# Patient Record
Sex: Female | Born: 1950 | Hispanic: No | Marital: Married | Smoking: Never smoker
Health system: Southern US, Community
[De-identification: ages and names within clinical notes are randomized; demographics above are authoritative.]

## PROBLEM LIST (undated history)

## (undated) DIAGNOSIS — L309 Dermatitis, unspecified: Secondary | ICD-10-CM

## (undated) DIAGNOSIS — I10 Essential (primary) hypertension: Secondary | ICD-10-CM

## (undated) DIAGNOSIS — T7840XA Allergy, unspecified, initial encounter: Secondary | ICD-10-CM

## (undated) DIAGNOSIS — D126 Benign neoplasm of colon, unspecified: Secondary | ICD-10-CM

## (undated) DIAGNOSIS — Z803 Family history of malignant neoplasm of breast: Secondary | ICD-10-CM

## (undated) HISTORY — DX: Benign neoplasm of colon, unspecified: D12.6

## (undated) HISTORY — DX: Allergy, unspecified, initial encounter: T78.40XA

## (undated) HISTORY — DX: Essential (primary) hypertension: I10

## (undated) HISTORY — PX: TUBAL LIGATION: SHX77

## (undated) HISTORY — DX: Family history of malignant neoplasm of breast: Z80.3

## (undated) HISTORY — DX: Dermatitis, unspecified: L30.9

---

## 1997-11-28 ENCOUNTER — Other Ambulatory Visit: Admission: RE | Admit: 1997-11-28 | Discharge: 1997-11-28 | Payer: Self-pay | Admitting: Obstetrics and Gynecology

## 1999-12-20 ENCOUNTER — Other Ambulatory Visit: Admission: RE | Admit: 1999-12-20 | Discharge: 1999-12-20 | Payer: Self-pay | Admitting: Obstetrics and Gynecology

## 1999-12-20 ENCOUNTER — Encounter (INDEPENDENT_AMBULATORY_CARE_PROVIDER_SITE_OTHER): Payer: Self-pay

## 2002-04-02 ENCOUNTER — Other Ambulatory Visit: Admission: RE | Admit: 2002-04-02 | Discharge: 2002-04-02 | Payer: Self-pay | Admitting: Obstetrics and Gynecology

## 2002-09-19 HISTORY — PX: LASIK: SHX215

## 2003-05-15 ENCOUNTER — Other Ambulatory Visit: Admission: RE | Admit: 2003-05-15 | Discharge: 2003-05-15 | Payer: Self-pay | Admitting: Obstetrics and Gynecology

## 2004-05-19 ENCOUNTER — Other Ambulatory Visit: Admission: RE | Admit: 2004-05-19 | Discharge: 2004-05-19 | Payer: Self-pay | Admitting: Obstetrics and Gynecology

## 2004-07-23 ENCOUNTER — Ambulatory Visit: Payer: Self-pay | Admitting: Gastroenterology

## 2004-08-06 ENCOUNTER — Ambulatory Visit: Payer: Self-pay | Admitting: Gastroenterology

## 2004-09-17 ENCOUNTER — Ambulatory Visit: Payer: Self-pay | Admitting: Internal Medicine

## 2005-05-20 ENCOUNTER — Other Ambulatory Visit: Admission: RE | Admit: 2005-05-20 | Discharge: 2005-05-20 | Payer: Self-pay | Admitting: Obstetrics and Gynecology

## 2008-09-25 ENCOUNTER — Encounter: Admission: RE | Admit: 2008-09-25 | Discharge: 2008-09-25 | Payer: Self-pay | Admitting: Obstetrics and Gynecology

## 2010-10-10 ENCOUNTER — Encounter: Payer: Self-pay | Admitting: Obstetrics and Gynecology

## 2011-11-02 ENCOUNTER — Encounter: Payer: Self-pay | Admitting: Gastroenterology

## 2014-05-21 ENCOUNTER — Encounter: Payer: Self-pay | Admitting: Gastroenterology

## 2014-07-23 ENCOUNTER — Encounter: Payer: Self-pay | Admitting: Gastroenterology

## 2014-09-19 HISTORY — PX: COLONOSCOPY: SHX174

## 2014-09-22 ENCOUNTER — Ambulatory Visit (AMBULATORY_SURGERY_CENTER): Payer: BLUE CROSS/BLUE SHIELD | Admitting: *Deleted

## 2014-09-22 VITALS — Ht 67.0 in | Wt 192.8 lb

## 2014-09-22 DIAGNOSIS — Z1211 Encounter for screening for malignant neoplasm of colon: Secondary | ICD-10-CM

## 2014-09-22 MED ORDER — MOVIPREP 100 G PO SOLR: 1.0000 | Freq: Once | ORAL | Status: DC

## 2014-09-22 NOTE — Progress Notes (Signed)
Denies allergies to eggs or soy products. Denies complications with sedation or anesthesia. Denies O2 use. Denies use of diet or weight loss medications.  Emmi instructions given for colonoscopy.  

## 2014-10-03 ENCOUNTER — Encounter: Payer: Self-pay | Admitting: Gastroenterology

## 2014-10-03 ENCOUNTER — Ambulatory Visit (AMBULATORY_SURGERY_CENTER): Payer: BLUE CROSS/BLUE SHIELD | Admitting: Gastroenterology

## 2014-10-03 VITALS — BP 126/69 | HR 55 | Temp 98.0°F | Resp 17 | Ht 67.0 in | Wt 192.0 lb

## 2014-10-03 DIAGNOSIS — Z1211 Encounter for screening for malignant neoplasm of colon: Secondary | ICD-10-CM

## 2014-10-03 DIAGNOSIS — D122 Benign neoplasm of ascending colon: Secondary | ICD-10-CM

## 2014-10-03 DIAGNOSIS — K5730 Diverticulosis of large intestine without perforation or abscess without bleeding: Secondary | ICD-10-CM

## 2014-10-03 MED ORDER — SODIUM CHLORIDE 0.9 % IV SOLN: 500.0000 mL | INTRAVENOUS | Status: DC

## 2014-10-03 NOTE — Patient Instructions (Signed)

## 2014-10-03 NOTE — Progress Notes (Signed)
Report to PACU, RN, vss, BBS= Clear.  

## 2014-10-03 NOTE — Progress Notes (Signed)
Called to room to assist during endoscopic procedure.  Patient ID and intended procedure confirmed with present staff. Received instructions for my participation in the procedure from the performing physician.  

## 2014-10-03 NOTE — Op Note (Signed)
[facility]  [address] [place], [id number]   COLONOSCOPY PROCEDURE REPORT  PATIENT: Terri, [name]  MR#: [id number] BIRTHDATE: November 12, 1950 , 63  yrs. old GENDER: female ENDOSCOPIST: Milus Banister, MD PROCEDURE DATE:  10/03/2014 PROCEDURE:   Colonoscopy with snare polypectomy First Screening Colonoscopy - Avg.  risk and is 50 yrs.  old or older - No.  Prior Negative Screening - Now for repeat screening. 10 or more years since last screening  History of Adenoma - Now for follow-up colonoscopy & has been > or = to 3 yrs.  N/A  Polyps Removed Today? Yes. ASA CLASS:   Class II INDICATIONS:average risk for colon cancer (colonoscopy Dr. Sharlett Iles 2005 found no polyps) MEDICATIONS: Monitored anesthesia care and Propofol 200 mg IV  DESCRIPTION OF PROCEDURE:   After the risks benefits and alternatives of the procedure were thoroughly explained, informed consent was obtained.  The digital rectal exam revealed no abnormalities of the rectum.   The LB [id number]-[id number]  endoscope was introduced through the anus and advanced to the cecum, which was identified by both the appendix and ileocecal valve. No adverse events experienced.   The quality of the prep was excellent.  The instrument was then slowly withdrawn as the colon was fully examined.  COLON FINDINGS: One polyp was found, removed and sent to pathology. This was sessile, 22mm across, located in ascending segment, removed with snare/cautery (jar 1).  There several divertulum throughout the colon, most densly populated in left colon.  The examination was otherwise normal.  Retroflexed views revealed no abnormalities. The time to cecum=5 minutes 52 seconds.  Withdrawal time=8 minutes 44 seconds.  The scope was withdrawn and the procedure completed. COMPLICATIONS: There were no immediate complications.  ENDOSCOPIC IMPRESSION: One polyp was found, removed and sent to pathology.  There several divertulum  throughout the colon, most densly populated in left colon.  The examination was otherwise normal  RECOMMENDATIONS: 1. If the polyp(s) removed today are proven to be adenomatous (pre-cancerous) polyps, you will need a repeat colonoscopy in 5 years.  Otherwise you should continue to follow colorectal cancer screening guidelines for "routine risk" patients with colonoscopy in 10 years.  You will receive a letter within 1-2 weeks with the results of your biopsy as well as final recommendations.  Please call my office if you have not received a letter after 3 weeks. 2. Please try once daily fiber supplement with citrucel orange powder.  eSigned:  Milus Banister, MD 10/03/2014 8:17 AM

## 2014-10-06 ENCOUNTER — Telehealth: Payer: Self-pay | Admitting: *Deleted

## 2014-10-06 NOTE — Telephone Encounter (Signed)
  Follow up Call-  Call back number 10/03/2014  Post procedure Call Back phone  # [phone]  Permission to leave phone message Yes     Patient questions:  Do you have a fever, pain , or abdominal swelling? No. Pain Score  0 *  Have you tolerated food without any problems? Yes.    Have you been able to return to your normal activities? Yes.    Do you have any questions about your discharge instructions: Diet   No. Medications  No. Follow up visit  No.  Do you have questions or concerns about your Care? No.  Actions: * If pain score is 4 or above: No action needed, pain <4.

## 2014-10-12 ENCOUNTER — Encounter: Payer: Self-pay | Admitting: Gastroenterology

## 2014-11-14 ENCOUNTER — Telehealth: Payer: Self-pay | Admitting: Gastroenterology

## 2014-11-14 NOTE — Telephone Encounter (Signed)
I've only met her for screening colonoscopy (1 month ago).  I agree with decreasing fiber supplement by 1/2.  Would want to see her in office before advising on creams, ointments for anal itching.  She needs GI next available with myself or extender.  Thanks

## 2014-11-14 NOTE — Telephone Encounter (Signed)
Patient reports that she has been taking Metamucil as recommended at the colonoscopy, but now her stools are too bulky.  I have advised her to cut the dose in half and increase the amount of fluid in her diet.  She also reports rectal itching.  She reports that she does not have any external hemorrhoids, but has tried OTC hemorrhoid cream and it caused burning.  She is advised that she can also try gyne lotrimin OTC lotion to see if that helps for possible yeast.  She states that she also has not been able to tolerate OTC yeast creams in the past, caused burning.  Dr. Ardis Hughs please advise

## 2014-11-14 NOTE — Telephone Encounter (Signed)
appt is scheduled with the patient for 11/21/14 1:15 with Cecille Rubin Hvozdovic, PA

## 2014-11-21 ENCOUNTER — Encounter: Payer: Self-pay | Admitting: Physician Assistant

## 2014-11-21 ENCOUNTER — Ambulatory Visit (INDEPENDENT_AMBULATORY_CARE_PROVIDER_SITE_OTHER): Payer: BLUE CROSS/BLUE SHIELD | Admitting: Physician Assistant

## 2014-11-21 VITALS — BP 146/82 | HR 70 | Ht 67.0 in | Wt 191.0 lb

## 2014-11-21 DIAGNOSIS — K644 Residual hemorrhoidal skin tags: Secondary | ICD-10-CM

## 2014-11-21 DIAGNOSIS — K648 Other hemorrhoids: Secondary | ICD-10-CM

## 2014-11-21 MED ORDER — HYDROCORTISONE ACETATE 25 MG RE SUPP: 25.0000 mg | Freq: Two times a day (BID) | RECTAL | Status: DC

## 2014-11-21 NOTE — Patient Instructions (Signed)
We have sent the following medications to your pharmacy for you to pick up at your convenience:] Anusol HC supp.  Please purchase Recticare cream 5% which is over the counter and use it as needed to the external hemorrhoids.  Continue your fiber.  Today you have been given a hemorrhoid handout to read.   I appreciate the opportunity to care for you.

## 2014-11-21 NOTE — Progress Notes (Signed)
Patient ID: Terri [name], female   DOB: 28-Mar-1951, 64 y.o.   MRN: [id number]     History of Present Illness: Terri [name]  is a pleasant 64 year old female known to Dr. Ardis Hughs from a screening colonoscopy. She recently had her colonoscopy in 10/03/2014 at which time one polyp was found removed and sent to pathology. There were several diverticulum throughout the colon, most densely populated in the left colon. The exam was otherwise normal. The patient's polyp was adenomatous and she was sent a letter have a surveillance colonoscopy in 5 years.  Terri [name] is here today because she has had a sensation of incomplete evacuation and has to wipe copiously after bowel movements. Her stools have been fairly regular, but she never feels she fully empties. She is also had rectal itching. She has had scant bloody spotting on the toilet tissue after wiping on several occasions. She has not had any abdominal pain.       Past Medical History  Diagnosis Date  . Serrated adenoma of colon     Past Surgical History  Procedure Laterality Date  . Lasik  2004  . Tubal ligation     Family History  Problem Relation Age of Onset  . Colon cancer Neg Hx   . Esophageal cancer Neg Hx   . Rectal cancer Neg Hx   . Stomach cancer Neg Hx    History  Substance Use Topics  . Smoking status: Never Smoker   . Smokeless tobacco: Never Used  . Alcohol Use: 0.0 oz/week    0 Standard drinks or equivalent per week     Comment: occas   Current Outpatient Prescriptions  Medication Sig Dispense Refill  . Calcium Citrate (CITRACAL PO) One teaspoon daily    . hydrocortisone (ANUSOL-HC) 25 MG suppository Place 1 suppository (25 mg total) rectally 2 (two) times daily. 20 suppository 1   No current facility-administered medications for this visit.   No Known Allergies    Review of Systems: Gen: Denies any fever, chills, sweats, anorexia, fatigue, weakness, malaise, weight loss, and sleep disorder CV: Denies  chest pain, angina, palpitations, syncope, orthopnea, PND, peripheral edema, and claudication. Resp: Denies dyspnea at rest, dyspnea with exercise, cough, sputum, wheezing, coughing up blood, and pleurisy. GI: Denies vomiting blood, jaundice, and fecal incontinence.   Denies dysphagia or odynophagia. GU : Denies urinary burning, blood in urine, urinary frequency, urinary hesitancy, nocturnal urination, and urinary incontinence. MS: Denies joint pain, limitation of movement, and swelling, stiffness, low back pain, extremity pain. Denies muscle weakness, cramps, atrophy.  Derm: Denies rash, itching, dry skin, hives, moles, warts, or unhealing ulcers.  Psych: Denies depression, anxiety, memory loss, suicidal ideation, hallucinations, paranoia, and confusion. Heme: Denies bruising, bleeding, and enlarged lymph nodes. Neuro:  Denies any headaches, dizziness, paresthesia Endo:  Denies any problems with DM, thyroid, adrenal  Endoscopies: Colonoscopy as per history of present illness.  Physical Exam: General: Pleasant, well developed female in no acute distress Head: Normocephalic and atraumatic Eyes:  sclerae anicteric, conjunctiva pink  Ears: Normal auditory acuity Lungs: Clear throughout to auscultation Heart: Regular rate and rhythm Abdomen: Soft, non distended, non-tender. No masses, no hepatomegaly. Normal bowel sounds Rectal: 2 small external hemorrhoids, anoscopy with a left lateral and posterior internal hemorrhoid Musculoskeletal: Symmetrical with no gross deformities  Extremities: No edema  Neurological: Alert oriented x 4, grossly nonfocal Psychological:  Alert and cooperative. Normal mood and affect  Assessment and Recommendations: 64 year old female with a sensation of incomplete evacuation and some anal  seepage here for evaluation her symptoms are likely due to her internal hemorrhoids. She's been advised to use her Citrucel twice daily. She will use Anusol HC suppositories 1 per  rectum twice a day for 10 days she will use Tucks wipes along with rectal care cream to the external hemorrhoids as needed. She will follow up as needed.    Mackenzi Krogh, Vita Barley PA-C 11/21/2014,

## 2014-11-24 NOTE — Progress Notes (Signed)
I agree with the above note, plan 

## 2014-12-03 ENCOUNTER — Other Ambulatory Visit: Payer: Self-pay | Admitting: Obstetrics and Gynecology

## 2014-12-04 LAB — CYTOLOGY - PAP

## 2017-06-29 ENCOUNTER — Ambulatory Visit (INDEPENDENT_AMBULATORY_CARE_PROVIDER_SITE_OTHER): Payer: Medicare Other | Admitting: Nurse Practitioner

## 2017-06-29 ENCOUNTER — Encounter (INDEPENDENT_AMBULATORY_CARE_PROVIDER_SITE_OTHER): Payer: Self-pay

## 2017-06-29 ENCOUNTER — Encounter: Payer: Self-pay | Admitting: Nurse Practitioner

## 2017-06-29 VITALS — BP 130/80 | HR 84 | Ht 67.0 in | Wt 192.0 lb

## 2017-06-29 DIAGNOSIS — L290 Pruritus ani: Secondary | ICD-10-CM

## 2017-06-29 DIAGNOSIS — L918 Other hypertrophic disorders of the skin: Secondary | ICD-10-CM | POA: Diagnosis not present

## 2017-06-29 MED ORDER — HYDROCORTISONE 2.5 % RE CREA: 1.0000 "application " | TOPICAL_CREAM | Freq: Every day | RECTAL | 0 refills | Status: DC

## 2017-06-29 NOTE — Patient Instructions (Signed)
If you are age 67 or older, your body mass index should be between 23-30. Your Body mass index is 30.07 kg/m. If this is out of the aforementioned range listed, please consider follow up with your Primary Care Provider.  If you are age 110 or younger, your body mass index should be between 19-25. Your Body mass index is 30.07 kg/m. If this is out of the aformentioned range listed, please consider follow up with your Primary Care Provider.   We have sent the following medications to your pharmacy for you to pick up at your convenience: Anusol cream  Thank you for choosing me and [facility].   Tye Savoy, NP

## 2017-06-29 NOTE — Progress Notes (Signed)
     Chief Complaint:  "sore on anus"  HPI: Patient is a 66 year old female known to Dr. Ardis Hughs. She has screening colonoscopy January 2016 with removal of a 7 mm serrated adenoma without dysplasia.  Patient comes in today for evaluation of what she thinks is a sore on the anus. She began having problems several months ago. The area burns when in contact with urine. She gets irritation from wiping and this sometimes leads to scant bleeding. She has excessive perianal itching  She has tried several over-the-counter creams including topical that she uses for her eczema. She cleans with wet wipes and then blots herself dry. Does not seem like anything that she has tried has made any difference. No pain inside the rectum. No unusually painful to have a BM. No abdominal pain. She is not constipated, does not strain.    Past Medical History:  Diagnosis Date  . Eczema   . Hypertension   . Serrated adenoma of colon     Patient's surgical history, family medical history, social history, medications and allergies were all reviewed in Epic    Physical Exam: BP 130/80   Pulse 84   Ht 5\' 7"  (1.702 m)   Wt 192 lb (87.1 kg)   BMI 30.07 kg/m   GENERAL:  Well developed white female in NAD PSYCH: :Pleasant, cooperative, normal affect RECTAL: at anterior midline there is a flesh colored bi -lobed tag. Unable to appreciate any fissures in or around the tag but it is slightly tender and erythematous at base.  PULM: Normal respiratory effort NEURO: Alert and oriented x 3, no focal neurologic deficits  ASSESSMENT and PLAN:   Pleasant 66 year old female with excessive perianal itching / discomfort and minor intermittent bleeding when wiping. Main complaint is that of itching which leads to excessive wiping and irritation. On exam she what appears to an irritated / tender hemorrhoidal tag vrs sentinel tag at anterior midline. Anoscopic exam not done -anusol cream in and around rectum for 7-10 days.    -can use wet wipes to gently clean after BM but the blot dry. -call me in a week with an update. If not improving we could ask colorectal surgery to evaluate her.     Tye Savoy , NP 06/29/2017, 8:50 AM

## 2017-07-02 NOTE — Progress Notes (Signed)
I agree with the above note, plan 

## 2017-07-03 ENCOUNTER — Telehealth: Payer: Self-pay | Admitting: Nurse Practitioner

## 2017-07-03 NOTE — Telephone Encounter (Signed)
Called patient for condition update. The perianal itching has significantly improved with anusol cream. Since the itching has improved she isn't irritating the anterior midline tag as much now. She will complete the tube of anusol cream and call me back if recurrent problems.

## 2019-09-16 ENCOUNTER — Encounter: Payer: Self-pay | Admitting: Gastroenterology

## 2019-09-16 NOTE — Telephone Encounter (Signed)
Opened in ERROR

## 2019-10-02 ENCOUNTER — Encounter: Payer: Self-pay | Admitting: Gastroenterology

## 2019-10-02 ENCOUNTER — Ambulatory Visit (AMBULATORY_SURGERY_CENTER): Payer: Self-pay

## 2019-10-02 ENCOUNTER — Other Ambulatory Visit: Payer: Self-pay

## 2019-10-02 VITALS — Temp 98.0°F | Ht 67.0 in | Wt 202.2 lb

## 2019-10-02 DIAGNOSIS — Z86010 Personal history of colonic polyps: Secondary | ICD-10-CM

## 2019-10-02 DIAGNOSIS — Z01818 Encounter for other preprocedural examination: Secondary | ICD-10-CM

## 2019-10-02 MED ORDER — NA SULFATE-K SULFATE-MG SULF 17.5-3.13-1.6 GM/177ML PO SOLN: 1.0000 | Freq: Once | ORAL | 0 refills | Status: AC

## 2019-10-02 NOTE — Progress Notes (Signed)

## 2019-10-08 ENCOUNTER — Ambulatory Visit (INDEPENDENT_AMBULATORY_CARE_PROVIDER_SITE_OTHER): Payer: Medicare Other

## 2019-10-08 DIAGNOSIS — Z1159 Encounter for screening for other viral diseases: Secondary | ICD-10-CM

## 2019-10-08 LAB — SARS CORONAVIRUS 2 (TAT 6-24 HRS): SARS Coronavirus 2: NEGATIVE

## 2019-10-11 ENCOUNTER — Ambulatory Visit (AMBULATORY_SURGERY_CENTER): Payer: Medicare Other | Admitting: Gastroenterology

## 2019-10-11 ENCOUNTER — Other Ambulatory Visit: Payer: Self-pay

## 2019-10-11 ENCOUNTER — Encounter: Payer: Self-pay | Admitting: Gastroenterology

## 2019-10-11 VITALS — BP 119/60 | HR 61 | Temp 98.3°F | Resp 31 | Ht 67.0 in | Wt 202.0 lb

## 2019-10-11 DIAGNOSIS — Z86010 Personal history of colonic polyps: Secondary | ICD-10-CM

## 2019-10-11 MED ORDER — SODIUM CHLORIDE 0.9 % IV SOLN
500.0000 mL | Freq: Once | INTRAVENOUS | Status: DC
Start: 2019-10-11 — End: 2019-10-11

## 2019-10-11 NOTE — Patient Instructions (Signed)
Thank you for letting us take care of  Your healthcare needs today, Please see handouts given to you on Diverticulosis   YOU HAD AN ENDOSCOPIC PROCEDURE TODAY AT [facility]:   Refer to the procedure report that was given to you for any specific questions about what was found during the examination.  If the procedure report does not answer your questions, please call your gastroenterologist to clarify.  If you requested that your care partner not be given the details of your procedure findings, then the procedure report has been included in a sealed envelope for you to review at your convenience later.  YOU SHOULD EXPECT: Some feelings of bloating in the abdomen. Passage of more gas than usual.  Walking can help get rid of the air that was put into your GI tract during the procedure and reduce the bloating. If you had a lower endoscopy (such as a colonoscopy or flexible sigmoidoscopy) you may notice spotting of blood in your stool or on the toilet paper. If you underwent a bowel prep for your procedure, you may not have a normal bowel movement for a few days.  Please Note:  You might notice some irritation and congestion in your nose or some drainage.  This is from the oxygen used during your procedure.  There is no need for concern and it should clear up in a day or so.  SYMPTOMS TO REPORT IMMEDIATELY:   Following lower endoscopy (colonoscopy or flexible sigmoidoscopy):  Excessive amounts of blood in the stool  Significant tenderness or worsening of abdominal pains  Swelling of the abdomen that is new, acute  Fever of 100F or higher   For urgent or emergent issues, a gastroenterologist can be reached at any hour by calling [phone].   DIET:  We do recommend a small meal at first, but then you may proceed to your regular diet.  Drink plenty of fluids but you should avoid alcoholic beverages for 24 hours.  ACTIVITY:  You should plan to take it easy for the rest of  today and you should NOT DRIVE or use heavy machinery until tomorrow (because of the sedation medicines used during the test).    FOLLOW UP: Our staff will call the number listed on your records 48-72 hours following your procedure to check on you and address any questions or concerns that you may have regarding the information given to you following your procedure. If we do not reach you, we will leave a message.  We will attempt to reach you two times.  During this call, we will ask if you have developed any symptoms of COVID 19. If you develop any symptoms (ie: fever, flu-like symptoms, shortness of breath, cough etc.) before then, please call [phone].  If you test positive for Covid 19 in the 2 weeks post procedure, please call and report this information to [place].    If any biopsies were taken you will be contacted by phone or by letter within the next 1-3 weeks.  Please call us at [phone] if you have not heard about the biopsies in 3 weeks.    SIGNATURES/CONFIDENTIALITY: You and/or your care partner have signed paperwork which will be entered into your electronic medical record.  These signatures attest to the fact that that the information above on your After Visit Summary has been reviewed and is understood.  Full responsibility of the confidentiality of this discharge information lies with you and/or your care-partner.

## 2019-10-11 NOTE — Progress Notes (Signed)
Pt's states no medical or surgical changes since previsit or office visit.  Temp- [place]

## 2019-10-11 NOTE — Op Note (Signed)
[place] Patient Name: Dia Luc Procedure Date: 10/11/2019 2:16 PM MRN: [id number] Endoscopist: Milus Banister , MD Age: 69 Referring MD:  Date of Birth: 03-01-1951 Gender: Female Account #: [id number] Procedure:                Colonoscopy Indications:              High risk colon cancer surveillance: Personal                            history of colonic polyps; Colonoscopy 09/2014                            single subCM SSA Medicines:                Monitored Anesthesia Care Procedure:                Pre-Anesthesia Assessment:                           - Prior to the procedure, a History and Physical                            was performed, and patient medications and                            allergies were reviewed. The patient's tolerance of                            previous anesthesia was also reviewed. The risks                            and benefits of the procedure and the sedation                            options and risks were discussed with the patient.                            All questions were answered, and informed consent                            was obtained. Prior Anticoagulants: The patient has                            taken no previous anticoagulant or antiplatelet                            agents. ASA Grade Assessment: II - A patient with                            mild systemic disease. After reviewing the risks                            and benefits, the patient was deemed in  satisfactory condition to undergo the procedure.                           After obtaining informed consent, the colonoscope                            was passed under direct vision. Throughout the                            procedure, the patient's blood pressure, pulse, and                            oxygen saturations were monitored continuously. The                            Colonoscope was introduced through the anus  and                            advanced to the the cecum, identified by                            appendiceal orifice and ileocecal valve. The                            colonoscopy was performed without difficulty. The                            patient tolerated the procedure well. The quality                            of the bowel preparation was good. The ileocecal                            valve, appendiceal orifice, and rectum were                            photographed. Scope In: 2:23:50 PM Scope Out: 2:41:54 PM Scope Withdrawal Time: 0 hours 8 minutes 55 seconds  Total Procedure Duration: 0 hours 18 minutes 4 seconds  Findings:                 Multiple small and large-mouthed diverticula were                            found in the left colon.                           The exam was otherwise without abnormality on                            direct and retroflexion views. Complications:            No immediate complications. Estimated blood loss:                            None. Estimated Blood Loss:  Estimated blood loss: none. Impression:               - Diverticulosis in the left colon.                           - The examination was otherwise normal on direct                            and retroflexion views.                           - No polyps or cancers. Recommendation:           - Patient has a contact number available for                            emergencies. The signs and symptoms of potential                            delayed complications were discussed with the                            patient. Return to normal activities tomorrow.                            Written discharge instructions were provided to the                            patient.                           - Resume previous diet.                           - Continue present medications.                           - Repeat colonoscopy in 10 years for screening. Milus Banister,  MD 10/11/2019 2:45:46 PM This report has been signed electronically.

## 2019-10-11 NOTE — Progress Notes (Signed)
PT taken to PACU. Monitors in place. VSS. Report given to RN. 

## 2019-10-15 ENCOUNTER — Telehealth: Payer: Self-pay

## 2019-10-15 NOTE — Telephone Encounter (Signed)
  Follow up Call-  Call back number 10/11/2019  Post procedure Call Back phone  # [phone]  Permission to leave phone message Yes  Some recent data might be hidden     Patient questions:  Do you have a fever, pain , or abdominal swelling? No. Pain Score  0 *  Have you tolerated food without any problems? Yes.    Have you been able to return to your normal activities? Yes.    Do you have any questions about your discharge instructions: Diet   No. Medications  No. Follow up visit  No.  Do you have questions or concerns about your Care? No.  Actions: * If pain score is 4 or above: No action needed, pain <4.   1. Have you developed a fever since your procedure? No  2.   Have you had an respiratory symptoms (SOB or cough) since your procedure? No  3.   Have you tested positive for COVID 19 since your procedure? No  4.   Have you had any family members/close contacts diagnosed with the COVID 19 since your procedure?  No   If yes to any of these questions please route to Joylene [name], RN and Alphonsa Gin, RN.

## 2020-07-06 ENCOUNTER — Ambulatory Visit (INDEPENDENT_AMBULATORY_CARE_PROVIDER_SITE_OTHER): Payer: Medicare Other

## 2020-07-06 ENCOUNTER — Ambulatory Visit: Payer: Medicare Other | Admitting: Podiatry

## 2020-07-06 ENCOUNTER — Other Ambulatory Visit: Payer: Self-pay

## 2020-07-06 DIAGNOSIS — M7732 Calcaneal spur, left foot: Secondary | ICD-10-CM

## 2020-07-06 DIAGNOSIS — M7662 Achilles tendinitis, left leg: Secondary | ICD-10-CM | POA: Diagnosis not present

## 2020-07-06 MED ORDER — MELOXICAM 15 MG PO TABS: 15.0000 mg | ORAL_TABLET | Freq: Every day | ORAL | 1 refills | Status: DC

## 2020-07-06 MED ORDER — METHYLPREDNISOLONE 4 MG PO TBPK: ORAL_TABLET | ORAL | 0 refills | Status: DC

## 2020-07-06 NOTE — Progress Notes (Signed)
   HPI: 69 y.o. female presenting today as a new patient for evaluation of left Achilles tendon pain.  Patient has been treated in the past by [facility] and she was diagnosed with a heel spur to the left heel.  Her only treatment has been physical therapy which did not help alleviate symptoms.  Patient states that the pain began at the beginning of 2020.  She denies history of injury to the area. Also she states that she has been dealing with ingrown toenails.  She goes and gets pedicures routinely and they "take them out".  She is wondering if there is any other better solution for the ingrown toenails    Past Medical History:  Diagnosis Date  . Allergy    seasonal  . Eczema   . Hypertension   . Serrated adenoma of colon       Physical Exam: General: The patient is alert and oriented x3 in no acute distress.  Dermatology: Skin is warm, dry and supple bilateral lower extremities. Negative for open lesions or macerations.  Ingrowing nails noted to the medial borders of bilateral great toes with sensitivity to palpation  Vascular: Palpable pedal pulses bilaterally. No edema or erythema noted. Capillary refill within normal limits.  Neurological: Epicritic and protective threshold grossly intact bilaterally.   Musculoskeletal Exam: Pain on palpation noted to the posterior tubercle of the left calcaneus at the insertion of the Achilles tendon consistent with retrocalcaneal bursitis. Range of motion within normal limits. Muscle strength 5/5 in all muscle groups bilateral lower extremities.  Radiographic Exam:  Posterior calcaneal spur noted to the respective calcaneus on lateral view. No fracture or dislocation noted. Normal osseous mineralization noted.     Assessment: 1. Insertional Achilles tendinitis left 2.  Posterior heel spur left 3.  Ingrown toenails bilateral medial   Plan of Care:  1. Patient was evaluated. Radiographs were reviewed today. 2. Injection of 0.5 mL  Celestone Soluspan injected into the retrocalcaneal bursa. Care was taken to avoid direct injection into the Achilles tendon. 3.  Prescription for Medrol Dosepak 4.  Prescription for meloxicam 15 mg daily to begin after completion of the Dosepak 5.  Continue daily stretching exercises that she received from physical therapy 6.  Return to clinic in 4 weeks.  At this time we will address the ingrown toenails and will perform permanent partial nail matricectomy medial border the bilateral great toes  *Retired.  Does not want Achilles tendon surgery until after the holidays if she needs to have surgery.  Surgery was discussed today as an option since she has had Achilles tendon pain for over 1 year despite conservative treatment   Edrick Kins, DPM [facility]  Dr. Edrick Kins, DPM    [address], [place] [address]                Office [phone]  Fax [phone]

## 2020-07-24 ENCOUNTER — Other Ambulatory Visit: Payer: Self-pay | Admitting: Podiatry

## 2020-07-29 NOTE — Telephone Encounter (Signed)
Please advise 

## 2020-08-03 ENCOUNTER — Other Ambulatory Visit: Payer: Self-pay

## 2020-08-03 ENCOUNTER — Ambulatory Visit: Payer: Medicare Other | Admitting: Podiatry

## 2020-08-03 DIAGNOSIS — M7662 Achilles tendinitis, left leg: Secondary | ICD-10-CM

## 2020-08-03 DIAGNOSIS — L600 Ingrowing nail: Secondary | ICD-10-CM | POA: Diagnosis not present

## 2020-08-03 DIAGNOSIS — M7732 Calcaneal spur, left foot: Secondary | ICD-10-CM

## 2020-08-03 MED ORDER — GENTAMICIN SULFATE 0.1 % EX CREA: 1.0000 "application " | TOPICAL_CREAM | Freq: Two times a day (BID) | CUTANEOUS | 1 refills | Status: DC

## 2020-08-03 MED ORDER — DOXYCYCLINE HYCLATE 100 MG PO TABS: 100.0000 mg | ORAL_TABLET | Freq: Two times a day (BID) | ORAL | 0 refills | Status: DC

## 2020-08-03 NOTE — Progress Notes (Signed)
   Subjective: Patient presents today for evaluation of pain to the medial border of the bilateral great toes as well as the medial border of the left second toe. Patient is concerned for possible ingrown nail.  She states that she has had pain and tenderness associated to the ingrown toenails for several years off and on.  She routinely has them trimmed out by a pedicurist.  Patient presents today for further treatment and evaluation. Patient also presents for follow-up treatment and evaluation regarding left Achilles tendon pain.  Patient states that the injection and the prednisone helped significantly to reduce her pain.  She was able to go hiking in the [place] without pain since last visit.  She presents for further treatment evaluation  Past Medical History:  Diagnosis Date  . Allergy    seasonal  . Eczema   . Hypertension   . Serrated adenoma of colon     Objective:  General: Well developed, nourished, in no acute distress, alert and oriented x3   Dermatology: Skin is warm, dry and supple bilateral.  Medial border of the bilateral great toes as well as the medial border of the left second toe appears to be erythematous with evidence of an ingrowing nail. Pain on palpation noted to the border of the nail fold. The remaining nails appear unremarkable at this time. There are no open sores, lesions.  Vascular: Dorsalis Pedis artery and Posterior Tibial artery pedal pulses palpable. No lower extremity edema noted.   Neruologic: Grossly intact via light touch bilateral.  Musculoskeletal: Muscular strength within normal limits in all groups bilateral. Normal range of motion noted to all pedal and ankle joints.  Negative for any significant tenderness to palpation along the posterior tubercle of the left heel.  Assesement: #1 Paronychia with ingrowing nail medial border bilateral great toes, medial border left second toe #2  Insertional Achilles tendonitis left-improved  Plan of Care:   1. Patient evaluated.  2. Discussed treatment alternatives and plan of care. Explained nail avulsion procedure and post procedure course to patient. 3. Patient opted for permanent partial nail avulsion of the medial border bilateral great toes and the medial border of the left second toe.  4. Prior to procedure, local anesthesia infiltration utilized using 3 ml of a 50:50 mixture of 2% plain lidocaine and 0.5% plain marcaine in a normal hallux block fashion and a betadine prep performed.  5. Partial permanent nail avulsion with chemical matrixectomy performed using [id number] applications of phenol followed by alcohol flush.  6. Light dressing applied. 7.  Prescription for doxycycline 100 mg 2 times daily for prophylaxis  8.  Prescription for gentamicin cream applied daily  9.  Continue meloxicam as needed  10.  OTC power step insoles were provided for the patient  11.  Return to clinic 3 weeks.  Edrick Kins, DPM [facility]  Dr. Edrick Kins, [address]                                        [place], [place] [address]                Office [phone]  Fax [phone]

## 2020-08-24 ENCOUNTER — Other Ambulatory Visit: Payer: Self-pay

## 2020-08-24 ENCOUNTER — Ambulatory Visit: Payer: Medicare Other | Admitting: Podiatry

## 2020-08-24 DIAGNOSIS — M7662 Achilles tendinitis, left leg: Secondary | ICD-10-CM | POA: Diagnosis not present

## 2020-08-24 DIAGNOSIS — M7732 Calcaneal spur, left foot: Secondary | ICD-10-CM | POA: Diagnosis not present

## 2020-08-24 DIAGNOSIS — L600 Ingrowing nail: Secondary | ICD-10-CM | POA: Diagnosis not present

## 2020-08-24 NOTE — Progress Notes (Signed)
   Subjective: 69 y.o. female presents today status post permanent nail avulsion procedure of the medial border bilateral great toes and medial border left second toe that was performed on 08/03/2020.  Patient states that she is feeling much better.  No new complaints at this time  Past Medical History:  Diagnosis Date  . Allergy    seasonal  . Eczema   . Hypertension   . Serrated adenoma of colon     Objective: Skin is warm, dry and supple. Nail and respective nail fold appears to be healing appropriately. Open wound to the associated nail fold with a granular wound base and moderate amount of fibrotic tissue. Minimal drainage noted. Mild erythema around the periungual region likely due to phenol chemical matricectomy.  Assessment: #1 postop permanent partial nail avulsion medial border bilateral great toes and medial border left second toe #2 open wound periungual nail fold of respective digit.  #3 Achilles tendinitis left-currently asymptomatic  Plan of care: #1 patient was evaluated  #2 debridement of open wound was performed to the periungual border of the respective toe using a currette. Antibiotic ointment and Band-Aid was applied. #3  Continue OTC power step insoles patient is to return to clinic on a PRN basis.   Edrick Kins, DPM [facility]  Dr. Edrick Kins, [address]                                        [place], [place] [address]                Office [phone]  Fax [phone]

## 2020-08-27 ENCOUNTER — Other Ambulatory Visit: Payer: Self-pay | Admitting: Podiatry

## 2020-08-31 NOTE — Telephone Encounter (Signed)
Please advise 

## 2020-09-04 ENCOUNTER — Other Ambulatory Visit: Payer: Self-pay | Admitting: Physician Assistant

## 2020-09-04 DIAGNOSIS — Z780 Asymptomatic menopausal state: Secondary | ICD-10-CM

## 2020-12-15 ENCOUNTER — Other Ambulatory Visit: Payer: Medicare Other

## 2020-12-16 ENCOUNTER — Other Ambulatory Visit: Payer: Self-pay

## 2020-12-16 ENCOUNTER — Ambulatory Visit
Admission: RE | Admit: 2020-12-16 | Discharge: 2020-12-16 | Disposition: A | Discharge status: Home / Self Care | Payer: Medicare Other | Source: Ambulatory Visit | Attending: Physician Assistant | Admitting: Physician Assistant

## 2020-12-16 DIAGNOSIS — Z780 Asymptomatic menopausal state: Secondary | ICD-10-CM

## 2021-01-21 DIAGNOSIS — L218 Other seborrheic dermatitis: Secondary | ICD-10-CM | POA: Diagnosis not present

## 2021-01-21 DIAGNOSIS — X32XXXS Exposure to sunlight, sequela: Secondary | ICD-10-CM | POA: Diagnosis not present

## 2021-01-21 DIAGNOSIS — L508 Other urticaria: Secondary | ICD-10-CM | POA: Diagnosis not present

## 2021-01-21 DIAGNOSIS — L309 Dermatitis, unspecified: Secondary | ICD-10-CM | POA: Diagnosis not present

## 2021-01-21 DIAGNOSIS — L814 Other melanin hyperpigmentation: Secondary | ICD-10-CM | POA: Diagnosis not present

## 2021-02-02 DIAGNOSIS — H5201 Hypermetropia, right eye: Secondary | ICD-10-CM | POA: Diagnosis not present

## 2021-02-02 DIAGNOSIS — H2513 Age-related nuclear cataract, bilateral: Secondary | ICD-10-CM | POA: Diagnosis not present

## 2021-02-02 DIAGNOSIS — H5212 Myopia, left eye: Secondary | ICD-10-CM | POA: Diagnosis not present

## 2021-03-12 DIAGNOSIS — M25552 Pain in left hip: Secondary | ICD-10-CM | POA: Diagnosis not present

## 2021-03-12 DIAGNOSIS — I10 Essential (primary) hypertension: Secondary | ICD-10-CM | POA: Diagnosis not present

## 2021-03-12 DIAGNOSIS — R7303 Prediabetes: Secondary | ICD-10-CM | POA: Diagnosis not present

## 2021-04-15 DIAGNOSIS — Z1231 Encounter for screening mammogram for malignant neoplasm of breast: Secondary | ICD-10-CM | POA: Diagnosis not present

## 2021-09-19 DIAGNOSIS — C801 Malignant (primary) neoplasm, unspecified: Secondary | ICD-10-CM

## 2021-09-19 HISTORY — DX: Malignant (primary) neoplasm, unspecified: C80.1

## 2021-09-23 DIAGNOSIS — I10 Essential (primary) hypertension: Secondary | ICD-10-CM | POA: Diagnosis not present

## 2021-10-11 ENCOUNTER — Ambulatory Visit: Payer: Medicare Other | Admitting: Podiatry

## 2021-10-11 ENCOUNTER — Other Ambulatory Visit: Payer: Self-pay

## 2021-10-11 DIAGNOSIS — M7752 Other enthesopathy of left foot: Secondary | ICD-10-CM | POA: Diagnosis not present

## 2021-10-11 DIAGNOSIS — B351 Tinea unguium: Secondary | ICD-10-CM | POA: Diagnosis not present

## 2021-10-11 DIAGNOSIS — M79674 Pain in right toe(s): Secondary | ICD-10-CM | POA: Diagnosis not present

## 2021-10-11 DIAGNOSIS — M79675 Pain in left toe(s): Secondary | ICD-10-CM | POA: Diagnosis not present

## 2021-10-11 NOTE — Progress Notes (Signed)
° °  Subjective:  71 y.o. female presenting today for evaluation of thick discolored toenails to the bilateral great toes.  She says that she is only noticed the thickening and discoloration for the past 6 months.  She does have history of ingrown toenails with partial nail matricectomy's to these toes.  She presents for further treatment evaluation.  They are symptomatic and painful especially with pressure  Patient also states that she has stiffness and pain to the left ankle.  This is been ongoing and nagging for years.  She has had injections in the past with significant improvement and relief.  She is requesting injection today.  She presents for further treatment evaluation   Past Medical History:  Diagnosis Date   Allergy    seasonal   Eczema    Hypertension    Serrated adenoma of colon    Past Surgical History:  Procedure Laterality Date   COLONOSCOPY  2016   LASIK  2004   TUBAL LIGATION     No Known Allergies   Objective / Physical Exam:  General:  The patient is alert and oriented x3 in no acute distress. Dermatology:  Skin is warm, dry and supple bilateral lower extremities. Negative for open lesions or macerations.  Hyperkeratotic dystrophic nails noted to the bilateral great toes consistent with onychomycosis Vascular:  Palpable pedal pulses bilaterally. No edema or erythema noted. Capillary refill within normal limits. Neurological:  Epicritic and protective threshold grossly intact bilaterally.  Musculoskeletal Exam:  Pain on palpation to the anterior lateral medial aspects of the patient's left ankle. Mild edema noted. Range of motion within normal limits to all pedal and ankle joints bilateral. Muscle strength 5/5 in all groups bilateral.    Assessment: 1.  Capsulitis left ankle 2.  Onychomycosis of toenails bilateral great toes  Plan of Care:  1. Patient was evaluated.  2. Injection of 0.5 mL Celestone Soluspan injected in the patient's left ankle. 3.   Today we discussed different treatment options specifically regarding the onychomycosis of the toenails.  We discussed oral, topical, and laser antifungal treatment modalities.  Side effects and efficacies were discussed as well.  The patient opts for both topical and oral antifungal treatment.  She denies a history of liver pathology or symptoms and had recent blood work with her PCP which was normal. 4.  Prescription for Lamisil 250 mg #90 daily 5.  OTC tolcylen antifungal topical was provided for the patient to check 6.  Return to clinic in 6 months  Edrick Kins, DPM [facility]  Dr. Edrick Kins, DPM    [address], [place] [address]                Office [phone]  Fax [phone]

## 2021-10-12 ENCOUNTER — Telehealth: Payer: Self-pay | Admitting: Podiatry

## 2021-10-12 MED ORDER — TERBINAFINE HCL 250 MG PO TABS: 250.0000 mg | ORAL_TABLET | Freq: Every day | ORAL | 0 refills | Status: DC

## 2021-10-12 NOTE — Addendum Note (Signed)
Addended by: Edrick Kins on: 10/12/2021 05:24 PM   Modules accepted: Orders

## 2021-10-12 NOTE — Telephone Encounter (Signed)
Patient was suppose to receive medication at her pharmacy for toenail fungus. She was seen yesterday 10/11/2021  Please advise

## 2021-10-12 NOTE — Telephone Encounter (Signed)
Sent. Thanks. Sorry about that. - Dr. Amalia Hailey

## 2021-10-13 NOTE — Telephone Encounter (Signed)
Patient notified and stated that pharmacy contacted her last night and said rx was ready.

## 2022-01-03 DIAGNOSIS — L282 Other prurigo: Secondary | ICD-10-CM | POA: Diagnosis not present

## 2022-02-10 DIAGNOSIS — Z129 Encounter for screening for malignant neoplasm, site unspecified: Secondary | ICD-10-CM | POA: Diagnosis not present

## 2022-02-10 DIAGNOSIS — L820 Inflamed seborrheic keratosis: Secondary | ICD-10-CM | POA: Diagnosis not present

## 2022-02-10 DIAGNOSIS — D239 Other benign neoplasm of skin, unspecified: Secondary | ICD-10-CM | POA: Diagnosis not present

## 2022-02-10 DIAGNOSIS — I872 Venous insufficiency (chronic) (peripheral): Secondary | ICD-10-CM | POA: Diagnosis not present

## 2022-02-10 DIAGNOSIS — L218 Other seborrheic dermatitis: Secondary | ICD-10-CM | POA: Diagnosis not present

## 2022-03-30 DIAGNOSIS — R7303 Prediabetes: Secondary | ICD-10-CM | POA: Diagnosis not present

## 2022-03-30 DIAGNOSIS — Z23 Encounter for immunization: Secondary | ICD-10-CM | POA: Diagnosis not present

## 2022-03-30 DIAGNOSIS — I10 Essential (primary) hypertension: Secondary | ICD-10-CM | POA: Diagnosis not present

## 2022-03-30 DIAGNOSIS — Z0000 Encounter for general adult medical examination without abnormal findings: Secondary | ICD-10-CM | POA: Diagnosis not present

## 2022-03-30 DIAGNOSIS — Z136 Encounter for screening for cardiovascular disorders: Secondary | ICD-10-CM | POA: Diagnosis not present

## 2022-04-11 ENCOUNTER — Ambulatory Visit: Payer: Medicare Other | Admitting: Podiatry

## 2022-04-11 DIAGNOSIS — M79675 Pain in left toe(s): Secondary | ICD-10-CM

## 2022-04-11 DIAGNOSIS — B351 Tinea unguium: Secondary | ICD-10-CM

## 2022-04-11 DIAGNOSIS — M79674 Pain in right toe(s): Secondary | ICD-10-CM

## 2022-04-11 NOTE — Progress Notes (Signed)
   Chief Complaint  Patient presents with   Nail Problem    bilateral hallux toenials growing back weird from previous nail procedures 6 MONTH F/U    Subjective:  71 y.o. female presenting today for follow-up evaluation of thick discolored toenails to the bilateral great toes.  Patient has history of partial nail matricectomy to the bilateral great toenails.  Also in January 2023 she was prescribed oral Lamisil and completed the 90 days.  She says that the nails are not growing very much.  She presents for further treatment and evaluation  Past Medical History:  Diagnosis Date   Allergy    seasonal   Eczema    Hypertension    Serrated adenoma of colon    Past Surgical History:  Procedure Laterality Date   COLONOSCOPY  2016   LASIK  2004   TUBAL LIGATION     No Known Allergies   Objective / Physical Exam:  General:  The patient is alert and oriented x3 in no acute distress. Dermatology:  Skin is warm, dry and supple bilateral lower extremities. Negative for open lesions or macerations.  There continues to be some hyperkeratotic dystrophic nails noted to the bilateral great toes.  The base of the nails bilaterally appear healthy and viable. Vascular:  Palpable pedal pulses bilaterally. No edema or erythema noted. Capillary refill within normal limits. Neurological:  Epicritic and protective threshold grossly intact bilaterally.  Musculoskeletal Exam:  No gross pedal deformity noted.  Negative for any significant tenderness to palpation left ankle   Assessment: 1.  Capsulitis left ankle 2.  Onychomycosis of toenails bilateral great toes  Plan of Care:  1. Patient was evaluated.  2.  Patient states that her ankle is doing well and approximately 90% better.  She only has occasional intermittent pain that is short-lived 3.  Light debridement of the bilateral great toenails was performed using a nail nipper without incident or bleeding. 4.  Recommend OTC biotin supplement  good for skin hair and nail growth 5.  Also recommend urea topical nail gel available on [employer] 6.  OTC power step insoles dispensed at checkout.  Patient has a pair at home and states that they really help her ankle instability 7.  Return to clinic as needed  Edrick Kins, DPM [facility]  Dr. Edrick Kins, DPM    [address], [place] [address]                Office [phone]  Fax [phone]

## 2022-04-11 NOTE — Patient Instructions (Signed)
Urea nail gel available on [employer] supplement available at any pharmacy

## 2022-05-05 DIAGNOSIS — Z1231 Encounter for screening mammogram for malignant neoplasm of breast: Secondary | ICD-10-CM | POA: Diagnosis not present

## 2022-05-09 ENCOUNTER — Other Ambulatory Visit: Payer: Self-pay | Admitting: Obstetrics & Gynecology

## 2022-05-09 DIAGNOSIS — R928 Other abnormal and inconclusive findings on diagnostic imaging of breast: Secondary | ICD-10-CM

## 2022-05-12 ENCOUNTER — Ambulatory Visit
Admission: RE | Admit: 2022-05-12 | Discharge: 2022-05-12 | Disposition: A | Discharge status: Home / Self Care | Payer: Medicare Other | Source: Ambulatory Visit | Attending: Obstetrics & Gynecology | Admitting: Obstetrics & Gynecology

## 2022-05-12 DIAGNOSIS — R928 Other abnormal and inconclusive findings on diagnostic imaging of breast: Secondary | ICD-10-CM

## 2022-05-12 DIAGNOSIS — R921 Mammographic calcification found on diagnostic imaging of breast: Secondary | ICD-10-CM | POA: Diagnosis not present

## 2022-05-13 ENCOUNTER — Other Ambulatory Visit: Payer: Self-pay | Admitting: Obstetrics & Gynecology

## 2022-05-13 DIAGNOSIS — R928 Other abnormal and inconclusive findings on diagnostic imaging of breast: Secondary | ICD-10-CM

## 2022-05-17 DIAGNOSIS — H524 Presbyopia: Secondary | ICD-10-CM | POA: Diagnosis not present

## 2022-05-17 DIAGNOSIS — H2513 Age-related nuclear cataract, bilateral: Secondary | ICD-10-CM | POA: Diagnosis not present

## 2022-05-17 DIAGNOSIS — H04123 Dry eye syndrome of bilateral lacrimal glands: Secondary | ICD-10-CM | POA: Diagnosis not present

## 2022-05-25 ENCOUNTER — Ambulatory Visit
Admission: RE | Admit: 2022-05-25 | Discharge: 2022-05-25 | Disposition: A | Discharge status: Home / Self Care | Payer: Medicare Other | Source: Ambulatory Visit | Attending: Obstetrics & Gynecology | Admitting: Obstetrics & Gynecology

## 2022-05-25 DIAGNOSIS — R921 Mammographic calcification found on diagnostic imaging of breast: Secondary | ICD-10-CM

## 2022-05-25 DIAGNOSIS — R928 Other abnormal and inconclusive findings on diagnostic imaging of breast: Secondary | ICD-10-CM

## 2022-05-25 DIAGNOSIS — D0511 Intraductal carcinoma in situ of right breast: Secondary | ICD-10-CM | POA: Diagnosis not present

## 2022-05-27 ENCOUNTER — Telehealth: Payer: Self-pay | Admitting: Hematology

## 2022-05-27 NOTE — Telephone Encounter (Signed)
Spoke to patient to confirm afternoon clinic appointment for 9/13, paperwork will be sent via mail

## 2022-05-30 ENCOUNTER — Encounter: Payer: Self-pay | Admitting: *Deleted

## 2022-05-30 DIAGNOSIS — D0511 Intraductal carcinoma in situ of right breast: Secondary | ICD-10-CM | POA: Insufficient documentation

## 2022-05-30 NOTE — Progress Notes (Signed)
Radiation Oncology         [phone] ________________________________  Name: Terri [name]        MRN: [id number]  Date of Service: 06/01/2022 DOB: [birth year]  CC:Sun, Gari Crown, MD  Donald Prose, MD     REFERRING PHYSICIAN: Donald Prose, MD   DIAGNOSIS: The encounter diagnosis was Ductal carcinoma in situ (DCIS) of right breast.   HISTORY OF PRESENT ILLNESS: Terri [name] is a 71 y.o. female seen in the multidisciplinary breast clinic for a new diagnosis of right breast cancer. The patient was noted to have screening detected calcifications in the right breast.  Diagnostic mammogram on 05/12/2022 confirmed pleomorphic heterogeneous calcifications in the outer right breast spanning 5.5 cm many of these appeared in linear orientation.  She underwent stereotactic biopsy on 05/25/2022.  Final pathology of both specimens showed high-grade DCIS with comedonecrosis and calcifications in both specimens.  The first specimen showed ER/PR positive findings with strong staining intensity of both and the second specimen showed strong ER positive PR negative testing.  She is seen to discuss treatment recommendations of her cancer.    PREVIOUS RADIATION THERAPY: No   PAST MEDICAL HISTORY:  Past Medical History:  Diagnosis Date   Allergy    seasonal   Eczema    Hypertension    Serrated adenoma of colon        PAST SURGICAL HISTORY: Past Surgical History:  Procedure Laterality Date   COLONOSCOPY  2016   LASIK  2004   TUBAL LIGATION       FAMILY HISTORY:  Family History  Problem Relation Age of Onset   Lung cancer Sister        x 3   Colon cancer Neg Hx    Esophageal cancer Neg Hx    Rectal cancer Neg Hx    Stomach cancer Neg Hx    Colon polyps Neg Hx      SOCIAL HISTORY:  reports that she has never smoked. She has never used smokeless tobacco. She reports current alcohol use. She reports that she does not use drugs.The patient is single and lives in [place]. She is  retired from working in [employer] and enjoys spending time with her grandchildren.    ALLERGIES: Patient has no known allergies.   MEDICATIONS:  Current Outpatient Medications  Medication Sig Dispense Refill   amLODipine (NORVASC) 5 MG tablet Take 5 mg by mouth daily.     Ascorbic Acid (VITAMIN C) 100 MG CHEW 1 tablet (Patient not taking: Reported on 04/11/2022)     aspirin (ASPIRIN 81) 81 MG chewable tablet 1 tablet     aspirin 81 MG chewable tablet Chew by mouth daily. (Patient not taking: Reported on 04/11/2022)     clobetasol cream (TEMOVATE) 0.34 % Apply 1 application topically as needed (eczema).     Cyanocobalamin (VITAMIN B 12) 100 MCG LOZG See admin instructions. (Patient not taking: Reported on 04/11/2022)     doxycycline (VIBRA-TABS) 100 MG tablet Take 1 tablet (100 mg total) by mouth 2 (two) times daily. (Patient not taking: Reported on 04/11/2022) 20 tablet 0   gentamicin cream (GARAMYCIN) 0.1 % Apply 1 application topically 2 (two) times daily. (Patient not taking: Reported on 04/11/2022) 30 g 1   hydrocortisone (ANUSOL-HC) 2.5 % rectal cream Place 1 application rectally at bedtime. Use for 7-10 days 30 g 0   ibuprofen (ADVIL) 800 MG tablet ibuprofen 800 mg tablet (Patient not taking: Reported on 04/11/2022)     losartan (COZAAR) 100 MG  tablet Take 100 mg by mouth daily.     meloxicam (MOBIC) 15 MG tablet TAKE 1 TABLET BY MOUTH EVERY DAY (Patient not taking: Reported on 04/11/2022) 30 tablet 1   Multiple Vitamin (MULTIVITAMIN) tablet Take 1 tablet by mouth daily.     oxybutynin (DITROPAN-XL) 10 MG 24 hr tablet Take 10 mg by mouth daily. (Patient not taking: Reported on 04/11/2022)     terbinafine (LAMISIL) 250 MG tablet Take 1 tablet (250 mg total) by mouth daily. (Patient not taking: Reported on 04/11/2022) 90 tablet 0   tetanus & diphtheria toxoids, adult, (TENIVAC) 5-2 LFU injection Tenivac (PF) 5 Lf unit-2 Lf unit/0.5 mL intramuscular syringe (Patient not taking: Reported on  04/11/2022)     vitamin B-12 (CYANOCOBALAMIN) 500 MCG tablet Take 1,000 mcg by mouth daily.  (Patient not taking: Reported on 04/11/2022)     No current facility-administered medications for this visit.     REVIEW OF SYSTEMS: On review of systems, the patient reports that she is doing well overall. No breast specific complaints are verbalized.      PHYSICAL EXAM:  Wt Readings from Last 3 Encounters:  10/11/19 202 lb (91.6 kg)  10/02/19 202 lb 3.2 oz (91.7 kg)  06/29/17 192 lb (87.1 kg)   Temp Readings from Last 3 Encounters:  10/11/19 98.3 F (36.8 C)  10/02/19 98 F (36.7 C) (Temporal)  10/03/14 98 F (36.7 C)   BP Readings from Last 3 Encounters:  10/11/19 119/60  06/29/17 130/80  11/21/14 (!) 146/82   Pulse Readings from Last 3 Encounters:  10/11/19 61  06/29/17 84  11/21/14 70    In general this is a well appearing caucasican female in no acute distress. She's alert and oriented x4 and appropriate throughout the examination. Cardiopulmonary assessment is negative for acute distress and she exhibits normal effort. Bilateral breast exam is deferred.    ECOG = 1  0 - Asymptomatic (Fully active, able to carry on all predisease activities without restriction)  1 - Symptomatic but completely ambulatory (Restricted in physically strenuous activity but ambulatory and able to carry out work of a light or sedentary nature. For example, light housework, office work)  2 - Symptomatic, <50% in bed during the day (Ambulatory and capable of all self care but unable to carry out any work activities. Up and about more than 50% of waking hours)  3 - Symptomatic, >50% in bed, but not bedbound (Capable of only limited self-care, confined to bed or chair 50% or more of waking hours)  4 - Bedbound (Completely disabled. Cannot carry on any self-care. Totally confined to bed or chair)  5 - Death   Eustace Pen MM, Creech RH, Tormey DC, et al. [phone]). "Toxicity and response criteria of the  [facility]". Kinde Oncol. 5 (6): 649-55    LABORATORY DATA:  No results found for: "WBC", "HGB", "HCT", "MCV", "PLT" No results found for: "NA", "K", "CL", "CO2" No results found for: "ALT", "AST", "GGT", "ALKPHOS", "BILITOT"    RADIOGRAPHY: MM CLIP PLACEMENT RIGHT  Result Date: 05/25/2022 CLINICAL DATA:  Status post stereotactic guided core biopsy of calcifications in the UPPER-OUTER QUADRANT the RIGHT breast. EXAM: 3D DIAGNOSTIC RIGHT MAMMOGRAM POST STEREOTACTIC BIOPSY x2 COMPARISON:  Previous exam(s). FINDINGS: 3D Mammographic images were obtained following stereotactic guided biopsy of calcifications in the posterior UPPER OUTER QUADRANT of the RIGHT breast and placement of a coil shaped clip. The biopsy marking clip is in expected position at the site of  biopsy. Following biopsy of calcifications in the anterior UPPER OUTER QUADRANT of the RIGHT breast, an X shaped clip was placed and is identified in the expected location. The coil and X shaped clips are 3.3 centimeters apart on the true LATERAL projection. IMPRESSION: Tissue marker clips are in the expected locations after biopsy. Final Assessment: Post Procedure Mammograms for Marker Placement Electronically Signed   By: Nolon Nations M.D.   On: 05/25/2022 12:32  MM RT BREAST BX W LOC DEV 1ST LESION IMAGE BX SPEC STEREO GUIDE  Result Date: 05/25/2022 CLINICAL DATA:  Patient presents for stereotactic guided core biopsy of RIGHT breast calcifications. EXAM: RIGHT BREAST STEREOTACTIC CORE NEEDLE BIOPSY x2 COMPARISON:  Previous exam(s). FINDINGS: The patient and I discussed the procedure of stereotactic-guided biopsy including benefits and alternatives. We discussed the high likelihood of a successful procedure. We discussed the risks of the procedure including infection, bleeding, tissue injury, clip migration, and inadequate sampling. Informed written consent was given. The usual time out protocol was performed  immediately prior to the procedure. Site 1: Lesion quadrant: Posterior UPPER OUTER QUADRANT RIGHT breast, coil clip Using sterile technique and lidocaine and lidocaine with epinephrine as local anesthetic, under stereotactic guidance, a 9 gauge vacuum assisted device was used to perform core needle biopsy of calcifications in the posterior UPPER OUTER QUADRANT of RIGHT breast using a LATERAL approach. Specimen radiograph was performed showing calcifications in numerous tissue samples. Specimens with calcifications are identified for pathology. At the conclusion of the procedure, a coil shaped tissue marker clip was deployed into the biopsy cavity. Site 2: Lesion quadrant: Anterior UPPER OUTER QUADRANT RIGHT breast, X clip Using sterile technique and lidocaine and lidocaine with epinephrine as local anesthetic, under stereotactic guidance, a 9 gauge vacuum assisted device was used to perform core needle biopsy of calcifications in the anterior UPPER OUTER QUADRANT of the RIGHT breast using a LATERAL approach. Specimen radiograph was performed showing calcifications in numerous tissue samples. Specimens with calcifications are identified for pathology. At the conclusion of the procedure, X tissue marker clip was deployed into the biopsy cavity. Follow-up 2-view mammogram was performed and dictated separately. IMPRESSION: Stereotactic-guided biopsy of anterior and posterior aspects of calcifications in the UPPER-OUTER QUADRANT of the RIGHT breast. No apparent complications. Electronically Signed   By: Nolon Nations M.D.   On: 05/25/2022 12:20  MM RT BREAST BX W LOC DEV EA AD LESION IMG BX SPEC STEREO GUIDE  Result Date: 05/25/2022 CLINICAL DATA:  Patient presents for stereotactic guided core biopsy of RIGHT breast calcifications. EXAM: RIGHT BREAST STEREOTACTIC CORE NEEDLE BIOPSY x2 COMPARISON:  Previous exam(s). FINDINGS: The patient and I discussed the procedure of stereotactic-guided biopsy including benefits  and alternatives. We discussed the high likelihood of a successful procedure. We discussed the risks of the procedure including infection, bleeding, tissue injury, clip migration, and inadequate sampling. Informed written consent was given. The usual time out protocol was performed immediately prior to the procedure. Site 1: Lesion quadrant: Posterior UPPER OUTER QUADRANT RIGHT breast, coil clip Using sterile technique and lidocaine and lidocaine with epinephrine as local anesthetic, under stereotactic guidance, a 9 gauge vacuum assisted device was used to perform core needle biopsy of calcifications in the posterior UPPER OUTER QUADRANT of RIGHT breast using a LATERAL approach. Specimen radiograph was performed showing calcifications in numerous tissue samples. Specimens with calcifications are identified for pathology. At the conclusion of the procedure, a coil shaped tissue marker clip was deployed into the biopsy cavity. Site 2: Lesion  quadrant: Anterior UPPER OUTER QUADRANT RIGHT breast, X clip Using sterile technique and lidocaine and lidocaine with epinephrine as local anesthetic, under stereotactic guidance, a 9 gauge vacuum assisted device was used to perform core needle biopsy of calcifications in the anterior UPPER OUTER QUADRANT of the RIGHT breast using a LATERAL approach. Specimen radiograph was performed showing calcifications in numerous tissue samples. Specimens with calcifications are identified for pathology. At the conclusion of the procedure, X tissue marker clip was deployed into the biopsy cavity. Follow-up 2-view mammogram was performed and dictated separately. IMPRESSION: Stereotactic-guided biopsy of anterior and posterior aspects of calcifications in the UPPER-OUTER QUADRANT of the RIGHT breast. No apparent complications. Electronically Signed   By: Nolon Nations M.D.   On: 05/25/2022 12:20  MM Digital Diagnostic Unilat R  Result Date: 05/12/2022 CLINICAL DATA:  71 year old female  for further evaluation of RIGHT breast calcifications identified on screening mammogram. EXAM: DIGITAL DIAGNOSTIC UNILATERAL RIGHT MAMMOGRAM TECHNIQUE: Right digital diagnostic mammography was performed. COMPARISON:  Previous exam(s). ACR Breast Density Category c: The breast tissue is heterogeneously dense, which may obscure small masses. FINDINGS: Full field and magnification views of the RIGHT breast demonstrate heterogeneous and pleomorphic calcifications within the OUTER RIGHT breast spanning a distance of 5.5 cm. Many of these calcifications are in a linear orientation. IMPRESSION: Suspicious OUTER RIGHT breast calcifications spanning a distance of 5.5 cm. Tissue sampling of the anterior and posterior aspects of these calcifications recommended. RECOMMENDATION: Two site stereotactic guided RIGHT breast biopsy, which will be arranged. I have discussed the findings and recommendations with the patient. If applicable, a reminder letter will be sent to the patient regarding the next appointment. BI-RADS CATEGORY  4: Suspicious. Electronically Signed   By: Margarette Canada M.D.   On: 05/12/2022 16:29      IMPRESSION/PLAN: 1. High grade, ER/PR positive DCIS of the right breast. Dr. Lisbeth Renshaw discusses the pathology findings and reviews the nature of right breast disease. The consensus from the breast conference includes breast conservation with lumpectomy. Dr. Lisbeth Renshaw would recommend adjuvant external radiotherapy to the breast  to reduce risks of local recurrence followed by antiestrogen therapy. We discussed the risks, benefits, short, and long term effects of radiotherapy, as well as the curative intent, and the patient is interested in proceeding. Dr. Lisbeth Renshaw discusses the delivery and logistics of radiotherapy and anticipates a course of 4 weeks of radiotherapy to the right breast. We will see her back a few weeks after surgery to discuss the simulation process and anticipate we starting radiotherapy about 4-6 weeks  after surgery.  2. Possible genetic predisposition to malignancy. The patient is a candidate for genetic testing given her personal history. She will meet with our geneticist today in clinic.   In a visit lasting 60 minutes, greater than 50% of the time was spent face to face reviewing her case, as well as in preparation of, discussing, and coordinating the patient's care.  The above documentation reflects my direct findings during this shared patient visit. Please see the separate note by Dr. Lisbeth Renshaw on this date for the remainder of the patient's plan of care.    Carola Rhine, [facility]    **Disclaimer: This note was dictated with voice recognition software. Similar sounding words can inadvertently be transcribed and this note may contain transcription errors which may not have been corrected upon publication of note.**

## 2022-06-01 ENCOUNTER — Inpatient Hospital Stay: Payer: Medicare Other | Attending: Hematology | Admitting: Hematology

## 2022-06-01 ENCOUNTER — Encounter: Payer: Self-pay | Admitting: Hematology

## 2022-06-01 ENCOUNTER — Encounter: Payer: Self-pay | Admitting: Emergency Medicine

## 2022-06-01 ENCOUNTER — Inpatient Hospital Stay: Payer: Medicare Other | Admitting: Genetic Counselor

## 2022-06-01 ENCOUNTER — Other Ambulatory Visit: Payer: Self-pay

## 2022-06-01 ENCOUNTER — Ambulatory Visit: Payer: Medicare Other | Admitting: Physical Therapy

## 2022-06-01 ENCOUNTER — Encounter: Payer: Self-pay | Admitting: *Deleted

## 2022-06-01 ENCOUNTER — Inpatient Hospital Stay: Payer: Medicare Other

## 2022-06-01 ENCOUNTER — Other Ambulatory Visit: Payer: Self-pay | Admitting: General Surgery

## 2022-06-01 ENCOUNTER — Ambulatory Visit
Admission: RE | Admit: 2022-06-01 | Discharge: 2022-06-01 | Disposition: A | Discharge status: Home / Self Care | Payer: Medicare Other | Source: Ambulatory Visit | Attending: Radiation Oncology | Admitting: Radiation Oncology

## 2022-06-01 ENCOUNTER — Telehealth: Payer: Self-pay | Admitting: Genetic Counselor

## 2022-06-01 VITALS — BP 159/79 | HR 89 | Temp 97.9°F | Resp 18 | Ht 67.0 in | Wt 193.4 lb

## 2022-06-01 DIAGNOSIS — Z79899 Other long term (current) drug therapy: Secondary | ICD-10-CM | POA: Insufficient documentation

## 2022-06-01 DIAGNOSIS — Z801 Family history of malignant neoplasm of trachea, bronchus and lung: Secondary | ICD-10-CM | POA: Insufficient documentation

## 2022-06-01 DIAGNOSIS — D0511 Intraductal carcinoma in situ of right breast: Secondary | ICD-10-CM

## 2022-06-01 DIAGNOSIS — C50411 Malignant neoplasm of upper-outer quadrant of right female breast: Secondary | ICD-10-CM | POA: Diagnosis not present

## 2022-06-01 DIAGNOSIS — Z170 Estrogen receptor positive status [ER+]: Secondary | ICD-10-CM | POA: Diagnosis not present

## 2022-06-01 LAB — CMP (CANCER CENTER ONLY)
ALT: 21 U/L (ref 0–44)
AST: 22 U/L (ref 15–41)
Albumin: 4.6 g/dL (ref 3.5–5.0)
Alkaline Phosphatase: 94 U/L (ref 38–126)
Anion gap: 7 (ref 5–15)
BUN: 15 mg/dL (ref 8–23)
CO2: 26 mmol/L (ref 22–32)
Calcium: 9.9 mg/dL (ref 8.9–10.3)
Chloride: 107 mmol/L (ref 98–111)
Creatinine: 0.71 mg/dL (ref 0.44–1.00)
GFR, Estimated: >60 mL/min (ref >60)
Glucose, Bld: 105 mg/dL — ABNORMAL HIGH (ref 70–99)
Potassium: 3.7 mmol/L (ref 3.5–5.1)
Sodium: 140 mmol/L (ref 135–145)
Total Bilirubin: 0.6 mg/dL (ref 0.3–1.2)
Total Protein: 7.3 g/dL (ref 6.5–8.1)

## 2022-06-01 LAB — CBC WITH DIFFERENTIAL (CANCER CENTER ONLY)
Abs Immature Granulocytes: 0.01 10*3/uL (ref 0.00–0.07)
Basophils Absolute: 0.1 10*3/uL (ref 0.0–0.1)
Basophils Relative: 1 %
Eosinophils Absolute: 0.2 10*3/uL (ref 0.0–0.5)
Eosinophils Relative: 2 %
HCT: 42.7 % (ref 36.0–46.0)
Hemoglobin: 14.5 g/dL (ref 12.0–15.0)
Immature Granulocytes: 0 %
Lymphocytes Relative: 21 %
Lymphs Abs: 1.6 10*3/uL (ref 0.7–4.0)
MCH: 30.1 pg (ref 26.0–34.0)
MCHC: 34 g/dL (ref 30.0–36.0)
MCV: 88.8 fL (ref 80.0–100.0)
Monocytes Absolute: 0.7 10*3/uL (ref 0.1–1.0)
Monocytes Relative: 9 %
Neutro Abs: 5.2 10*3/uL (ref 1.7–7.7)
Neutrophils Relative %: 67 %
Platelet Count: 251 10*3/uL (ref 150–400)
RBC: 4.81 MIL/uL (ref 3.87–5.11)
RDW: 12.6 % (ref 11.5–15.5)
WBC Count: 7.6 10*3/uL (ref 4.0–10.5)
nRBC: 0 % (ref 0.0–0.2)

## 2022-06-01 LAB — GENETIC SCREENING ORDER

## 2022-06-01 NOTE — Research (Signed)
MTG-015 - Tissue and Bodily Fluids: Translational Medicine: Discovery and Evaluation of Biomarkers/Pharmacogenomics for the Diagnosis and Personalized Management of Patients   INTRO STUDY/CONSENTS  Patient Terri [name] was identified by this Clinical Research Nurse as a potential candidate for the above listed study.  This Clinical Research Nurse met with Willis Modena, [id number], on 06/01/22 in a manner and location that ensures patient privacy to discuss participation in the above listed research study.  Patient is Accompanied by husband [name].  A copy of the informed consent document and separate HIPAA Authorization was provided to the patient.  Patient reads, speaks, and understands [place].   Patient was provided with the business card of this Nurse and encouraged to contact the research team with any questions.  Approximately 20 minutes were spent with the patient reviewing the informed consent documents.  Patient was provided the option of taking informed consent documents home to review and was encouraged to review at their convenience with their support network, including other care providers. Patient took the consent documents home to review.  Will f/u with patient in the next few days.  Wells Guiles 'Learta CoddingNeysa Bonito, RN, BSN Clinical Research Nurse I 06/01/22 3:44 PM

## 2022-06-01 NOTE — Progress Notes (Signed)
[facility] Psychosocial Distress Screening Spiritual Care  Met with Terri [name] in Breast Multidisciplinary Clinic to introduce [place] team/resources, reviewing distress screen per protocol.  The patient scored a 3 on the Psychosocial Distress Thermometer which indicates mild distress. Also assessed for distress and other psychosocial needs.      06/01/2022    3:56 PM  ONCBCN DISTRESS SCREENING  Distress experienced in past week (1-10) 2  Referral to support programs Yes   Patient expressed they have a good support system and have little anxieties about their diagnosis. [place].   Patient does not need a follow-up call but has counselor's card and will call if needs arise.   Follow up needed: No  Lysle Morales  Counseling Intern

## 2022-06-01 NOTE — Progress Notes (Signed)
[place]   [phone] Fax:([phone]) [place] Note   Patient Care Team: Donald Prose, MD as PCP - General (Family Medicine) Mauro Kaufmann, RN as Oncology Nurse Navigator Rockwell Germany, RN as Oncology Nurse Navigator Stark Klein, MD as Consulting Physician (General Surgery) Truitt Merle, MD as Consulting Physician (Hematology) Kyung Rudd, MD as Consulting Physician (Radiation Oncology)  Date of Service:  06/01/2022   CHIEF COMPLAINTS/PURPOSE OF CONSULTATION:  Right Breast Cancer, ER+  REFERRING PHYSICIAN:  [facility]   ASSESSMENT & PLAN:  Terri Terri [name] is a 71 y.o. postmenopausal Terri [name] with   1. Right breast DCIS, grade 3, ER+, PR+ -found on screening mammogram. Right MM on 05/12/22 showed 5.5 cm calcifications in outer breast. Biopsy on 05/25/22 confirmed DCIS to both sites, both ER+ but one PR+ and the other PR-. -I discussed her breast imaging and needle biopsy results with patient and her family members in great detail. -She is a candidate for breast conservation surgery. She has been seen by breast surgeon Dr. Barry Dienes, who recommends lumpectomy. -Her DCIS will be cured by complete surgical resection. Any form of adjuvant therapy is preventive. -I discussed the benefit of adjuvant antiestrogen therapy with low-dose tamoxifen for 3 years, she will reduce her future risk of breast cancer by 50%.  I discussed the potential side effect of tamoxifen, which includes but not limited to, hot flash, skin and vaginal dryness, slightly increased risk of cardiovascular disease and cataract, small risk of thrombosis and endometrial cancer, were discussed with her in great details.  If we give low-dose of 5 mg daily, the risk of thrombosis and endometrial cancer is minimal. -Due to her advanced age, not feel strongly that she needs both adjuvant radiation and tamoxifen, I think is reasonable to choose either one based on her preference.   She will meet radiation oncologist Dr. Lisbeth Renshaw today.  She will think about her options. -We discussed breast cancer surveillance after she completes treatment, Including annual mammogram, breast exam every 6-12 months.  2. Bone Health  -Her most recent DEXA on 12/16/20 was borderline normal, with T-score -1.0 at left femur neck.   PLAN:  -proceed with lumpectomy -She will think about options of adjuvant radiation versus tamoxifen -I will see her after surgery or radiation.    Oncology History Overview Note   Cancer Staging  Ductal carcinoma in situ (DCIS) of right breast Staging form: Breast, AJCC 8th Edition - Clinical stage from 05/25/2022: Stage 0 (cTis (DCIS), cN0, cM0, G2, ER+, PR+, HER2: Not Assessed) - Signed by Truitt Merle, MD on 05/31/2022    Ductal carcinoma in situ (DCIS) of right breast  05/12/2022 Mammogram   CLINICAL DATA:  71 year old Terri [name] for further evaluation of RIGHT breast calcifications identified on screening mammogram.   EXAM: DIGITAL DIAGNOSTIC UNILATERAL RIGHT MAMMOGRAM  IMPRESSION: Suspicious OUTER RIGHT breast calcifications spanning a distance of 5.5 cm. Tissue sampling of the anterior and posterior aspects of these calcifications recommended.   05/25/2022 Cancer Staging   Staging form: Breast, AJCC 8th Edition - Clinical stage from 05/25/2022: Stage 0 (cTis (DCIS), cN0, cM0, G2, ER+, PR+, HER2: Not Assessed) - Signed by Truitt Merle, MD on 05/31/2022 Stage prefix: Initial diagnosis Histologic grading system: 3 grade system   05/25/2022 Initial Biopsy   Diagnosis 1. Breast, right, needle core biopsy - DUCTAL CARCINOMA IN SITU, SOLID TYPE WITH COMEDONECROSIS AND ASSOCIATED CALCIFICATIONS, NUCLEAR GRADE 3 OF 3 - NECROSIS: PRESENT - CALCIFICATIONS: PRESENT -  DCIS LENGTH: 9 MM IN GREATEST LINEAR DIMENSION ON HEAVILY FRAGMENTED CORES  PROGNOSTIC INDICATORS Results: Estrogen Receptor: 90%, POSITIVE, STRONG STAINING INTENSITY Progesterone Receptor: 10%, POSITIVE,  STRONG STAINING INTENSITY   2. Breast, right, needle core biopsy - DUCTAL CARCINOMA IN SITU, SOLID TYPE WITH COMEDONECROSIS AND ASSOCIATED CALCIFICATIONS, NUCLEAR GRADE 3 OF 3 - NECROSIS: PRESENT - CALCIFICATIONS: PRESENT - DCIS LENGTH: 7 MM IN GREATEST LINEAR DIMENSION ON HEAVILY FRAGMENTED CORES  PROGNOSTIC INDICATORS Results: IMMUNOHISTOCHEMICAL AND MORPHOMETRIC ANALYSIS PERFORMED MANUALLY Estrogen Receptor: 20%, POSITIVE, STRONG STAINING INTENSITY Progesterone Receptor: 0%, NEGATIVE   05/30/2022 Initial Diagnosis   Ductal carcinoma in situ (DCIS) of right breast      HISTORY OF PRESENTING ILLNESS:  Terri Terri [name] 71 y.o. Terri [name] is a here because of breast cancer. The patient was [facility] by [facility]. The patient presents to the clinic today accompanied by her family.   Her breast cancer was found on screening mammogram.  Diagnostic mammogram on May 12, 2022 showed suspicious outer right breast calcifications spanning a distance of 5.5 cm.  She is asymptomatic, breast pain or any other new symptoms.  She underwent two biopsy of her right breast calcification on May 25, 2022, which both showed high-grade DCIS with focal necrosis and calcification, ER 90% positive, PR 10% positive.   GYN HISTORY *** Menarchal: xx LMP: xx Contraceptive: HRT:  GP:    REVIEW OF SYSTEMS:    Constitutional: Denies fevers, chills or abnormal night sweats Eyes: Denies blurriness of vision, double vision or watery eyes Ears, nose, mouth, throat, and face: Denies mucositis or sore throat Respiratory: Denies cough, dyspnea or wheezes Cardiovascular: Denies palpitation, chest discomfort or lower extremity swelling Gastrointestinal:  Denies nausea, heartburn or change in bowel habits Skin: Denies abnormal skin rashes Lymphatics: Denies new lymphadenopathy or easy bruising Neurological:Denies numbness, tingling or new weaknesses Behavioral/Psych: Mood is stable, no new changes   All other systems were reviewed with the patient and are negative.   MEDICAL HISTORY:  Past Medical History:  Diagnosis Date   Allergy    seasonal   Eczema    Hypertension    Serrated adenoma of colon     SURGICAL HISTORY: Past Surgical History:  Procedure Laterality Date   COLONOSCOPY  2016   LASIK  2004   TUBAL LIGATION      SOCIAL HISTORY: Social History   Socioeconomic History   Marital status: Single    Spouse name: Not on file   Number of children: 2   Years of education: Not on file   Highest education level: Not on file  Occupational History   Not on file  Tobacco Use   Smoking status: Never   Smokeless tobacco: Never  Vaping Use   Vaping Use: Never used  Substance and Sexual Activity   Alcohol use: Yes    Alcohol/week: 0.0 standard drinks of alcohol    Comment: occas   Drug use: No   Sexual activity: Not on file  Other Topics Concern   Not on file  Social History Narrative   Not on file   Social Determinants of Health   Financial Resource Strain: Not on file  Food Insecurity: Not on file  Transportation Needs: Not on file  Physical Activity: Not on file  Stress: Not on file  Social Connections: Not on file  Intimate Partner Violence: Not on file    FAMILY HISTORY: Family History  Problem Relation Age of Onset   Lung cancer Sister  x4   Colon cancer Neg Hx    Esophageal cancer Neg Hx    Rectal cancer Neg Hx    Stomach cancer Neg Hx    Colon polyps Neg Hx     ALLERGIES:  has No Known Allergies.  MEDICATIONS:  Current Outpatient Medications  Medication Sig Dispense Refill   amLODipine (NORVASC) 5 MG tablet Take 5 mg by mouth daily.     aspirin (ASPIRIN 81) 81 MG chewable tablet 1 tablet     ibuprofen (ADVIL) 800 MG tablet ibuprofen 800 mg tablet (Patient not taking: Reported on 04/11/2022)     losartan (COZAAR) 100 MG tablet Take 100 mg by mouth daily.     Multiple Vitamin (MULTIVITAMIN) tablet Take 1 tablet by mouth daily.      No current facility-administered medications for this visit.    PHYSICAL EXAMINATION: ECOG PERFORMANCE STATUS: 0 - Asymptomatic  Vitals:   06/01/22 1303  BP: (!) 159/79  Pulse: 89  Resp: 18  Temp: 97.9 F (36.6 C)  SpO2: 97%   Filed Weights   06/01/22 1303  Weight: 193 lb 6.4 oz (87.7 kg)    GENERAL:alert, no distress and comfortable SKIN: skin color, texture, turgor are normal, no rashes or significant lesions EYES: normal, Conjunctiva are pink and non-injected, sclera clear NECK: supple, thyroid normal size, non-tender, without nodularity LYMPH:  no palpable lymphadenopathy in the cervical, axillary  LUNGS: clear to auscultation and percussion with normal breathing effort HEART: regular rate & rhythm and no murmurs and no lower extremity edema ABDOMEN:abdomen soft, non-tender and normal bowel sounds Musculoskeletal:no cyanosis of digits and no clubbing  NEURO: alert & oriented x 3 with fluent speech, no focal motor/sensory deficits BREAST:  No palpable mass, nodules or adenopathy bilaterally. Breast exam benign.  LABORATORY DATA:  I have reviewed the data as listed    Latest Ref Rng & Units 06/01/2022   12:23 PM  CBC  WBC 4.0 - 10.5 K/uL 7.6   Hemoglobin 12.0 - 15.0 g/dL 14.5   Hematocrit 36.0 - 46.0 % 42.7   Platelets 150 - 400 K/uL 251        Latest Ref Rng & Units 06/01/2022   12:23 PM  CMP  Glucose 70 - 99 mg/dL 105   BUN 8 - 23 mg/dL 15   Creatinine 0.44 - 1.00 mg/dL 0.71   Sodium 135 - 145 mmol/L 140   Potassium 3.5 - 5.1 mmol/L 3.7   Chloride 98 - 111 mmol/L 107   CO2 22 - 32 mmol/L 26   Calcium 8.9 - 10.3 mg/dL 9.9   Total Protein 6.5 - 8.1 g/dL 7.3   Total Bilirubin 0.3 - 1.2 mg/dL 0.6   Alkaline Phos 38 - 126 U/L 94   AST 15 - 41 U/L 22   ALT 0 - 44 U/L 21      RADIOGRAPHIC STUDIES: I have personally reviewed the radiological images as listed and agreed with the findings in the report. MM RT BREAST BX W LOC DEV 1ST LESION IMAGE BX SPEC  STEREO GUIDE  Addendum Date: 05/30/2022   ADDENDUM REPORT: 05/30/2022 10:42 ADDENDUM: Pathology revealed GRADE III DUCTAL CARCINOMA IN SITU, SOLID TYPE WITH COMEDONECROSIS AND ASSOCIATED CALCIFICATIONS of the RIGHT breast, upper outer quadrant, posterior, (coil clip). This was found to be concordant by Dr. Nolon Nations. Pathology revealed GRADE III DUCTAL CARCINOMA IN SITU, SOLID TYPE WITH COMEDONECROSIS AND ASSOCIATED CALCIFICATIONS of the RIGHT breast, upper outer quadrant, anterior, (x clip). This was found to  be concordant by Dr. Norva Pavlov. Pathology results were discussed with the patient by telephone. The patient reported doing well after the biopsies with tenderness at the sites. Post biopsy instructions and care were reviewed and questions were answered. The patient was encouraged to call [facility] for any additional concerns. My direct phone number was provided. The patient was referred to [facility] on June 01, 2022. Pathology results reported by Rene Kocher, RN on 05/30/2022. Electronically Signed   By: Norva Pavlov M.D.   On: 05/30/2022 10:42   Result Date: 05/30/2022 CLINICAL DATA:  Patient presents for stereotactic guided core biopsy of RIGHT breast calcifications. EXAM: RIGHT BREAST STEREOTACTIC CORE NEEDLE BIOPSY x2 COMPARISON:  Previous exam(s). FINDINGS: The patient and I discussed the procedure of stereotactic-guided biopsy including benefits and alternatives. We discussed the high likelihood of a successful procedure. We discussed the risks of the procedure including infection, bleeding, tissue injury, clip migration, and inadequate sampling. Informed written consent was given. The usual time out protocol was performed immediately prior to the procedure. Site 1: Lesion quadrant: Posterior UPPER OUTER QUADRANT RIGHT breast, coil clip Using sterile technique and lidocaine  and lidocaine with epinephrine as local anesthetic, under stereotactic guidance, a 9 gauge vacuum assisted device was used to perform core needle biopsy of calcifications in the posterior UPPER OUTER QUADRANT of RIGHT breast using a LATERAL approach. Specimen radiograph was performed showing calcifications in numerous tissue samples. Specimens with calcifications are identified for pathology. At the conclusion of the procedure, a coil shaped tissue marker clip was deployed into the biopsy cavity. Site 2: Lesion quadrant: Anterior UPPER OUTER QUADRANT RIGHT breast, X clip Using sterile technique and lidocaine and lidocaine with epinephrine as local anesthetic, under stereotactic guidance, a 9 gauge vacuum assisted device was used to perform core needle biopsy of calcifications in the anterior UPPER OUTER QUADRANT of the RIGHT breast using a LATERAL approach. Specimen radiograph was performed showing calcifications in numerous tissue samples. Specimens with calcifications are identified for pathology. At the conclusion of the procedure, X tissue marker clip was deployed into the biopsy cavity. Follow-up 2-view mammogram was performed and dictated separately. IMPRESSION: Stereotactic-guided biopsy of anterior and posterior aspects of calcifications in the UPPER-OUTER QUADRANT of the RIGHT breast. No apparent complications. Electronically Signed: By: Norva Pavlov M.D. On: 05/25/2022 12:20  MM RT BREAST BX W LOC DEV EA AD LESION IMG BX SPEC STEREO GUIDE  Addendum Date: 05/30/2022   ADDENDUM REPORT: 05/30/2022 10:42 ADDENDUM: Pathology revealed GRADE III DUCTAL CARCINOMA IN SITU, SOLID TYPE WITH COMEDONECROSIS AND ASSOCIATED CALCIFICATIONS of the RIGHT breast, upper outer quadrant, posterior, (coil clip). This was found to be concordant by Dr. Norva Pavlov. Pathology revealed GRADE III DUCTAL CARCINOMA IN SITU, SOLID TYPE WITH COMEDONECROSIS AND ASSOCIATED CALCIFICATIONS of the RIGHT breast, upper outer  quadrant, anterior, (x clip). This was found to be concordant by Dr. Norva Pavlov. Pathology results were discussed with the patient by telephone. The patient reported doing well after the biopsies with tenderness at the sites. Post biopsy instructions and care were reviewed and questions were answered. The patient was encouraged to call [facility] for any additional concerns. My direct phone number was provided. The patient was referred to [facility] at [facility] on June 01, 2022. Pathology results reported by Rene Kocher, RN on 05/30/2022. Electronically Signed  By: Nolon Nations M.D.   On: 05/30/2022 10:42   Result Date: 05/30/2022 CLINICAL DATA:  Patient presents for stereotactic guided core biopsy of RIGHT breast calcifications. EXAM: RIGHT BREAST STEREOTACTIC CORE NEEDLE BIOPSY x2 COMPARISON:  Previous exam(s). FINDINGS: The patient and I discussed the procedure of stereotactic-guided biopsy including benefits and alternatives. We discussed the high likelihood of a successful procedure. We discussed the risks of the procedure including infection, bleeding, tissue injury, clip migration, and inadequate sampling. Informed written consent was given. The usual time out protocol was performed immediately prior to the procedure. Site 1: Lesion quadrant: Posterior UPPER OUTER QUADRANT RIGHT breast, coil clip Using sterile technique and lidocaine and lidocaine with epinephrine as local anesthetic, under stereotactic guidance, a 9 gauge vacuum assisted device was used to perform core needle biopsy of calcifications in the posterior UPPER OUTER QUADRANT of RIGHT breast using a LATERAL approach. Specimen radiograph was performed showing calcifications in numerous tissue samples. Specimens with calcifications are identified for pathology. At the conclusion of the procedure, a coil shaped tissue marker clip was  deployed into the biopsy cavity. Site 2: Lesion quadrant: Anterior UPPER OUTER QUADRANT RIGHT breast, X clip Using sterile technique and lidocaine and lidocaine with epinephrine as local anesthetic, under stereotactic guidance, a 9 gauge vacuum assisted device was used to perform core needle biopsy of calcifications in the anterior UPPER OUTER QUADRANT of the RIGHT breast using a LATERAL approach. Specimen radiograph was performed showing calcifications in numerous tissue samples. Specimens with calcifications are identified for pathology. At the conclusion of the procedure, X tissue marker clip was deployed into the biopsy cavity. Follow-up 2-view mammogram was performed and dictated separately. IMPRESSION: Stereotactic-guided biopsy of anterior and posterior aspects of calcifications in the UPPER-OUTER QUADRANT of the RIGHT breast. No apparent complications. Electronically Signed: By: Nolon Nations M.D. On: 05/25/2022 12:20  MM CLIP PLACEMENT RIGHT  Result Date: 05/25/2022 CLINICAL DATA:  Status post stereotactic guided core biopsy of calcifications in the UPPER-OUTER QUADRANT the RIGHT breast. EXAM: 3D DIAGNOSTIC RIGHT MAMMOGRAM POST STEREOTACTIC BIOPSY x2 COMPARISON:  Previous exam(s). FINDINGS: 3D Mammographic images were obtained following stereotactic guided biopsy of calcifications in the posterior UPPER OUTER QUADRANT of the RIGHT breast and placement of a coil shaped clip. The biopsy marking clip is in expected position at the site of biopsy. Following biopsy of calcifications in the anterior UPPER OUTER QUADRANT of the RIGHT breast, an X shaped clip was placed and is identified in the expected location. The coil and X shaped clips are 3.3 centimeters apart on the true LATERAL projection. IMPRESSION: Tissue marker clips are in the expected locations after biopsy. Final Assessment: Post Procedure Mammograms for Marker Placement Electronically Signed   By: Nolon Nations M.D.   On: 05/25/2022  12:32  MM Digital Diagnostic Unilat R  Result Date: 05/12/2022 CLINICAL DATA:  71 year old Terri [name] for further evaluation of RIGHT breast calcifications identified on screening mammogram. EXAM: DIGITAL DIAGNOSTIC UNILATERAL RIGHT MAMMOGRAM TECHNIQUE: Right digital diagnostic mammography was performed. COMPARISON:  Previous exam(s). ACR Breast Density Category c: The breast tissue is heterogeneously dense, which may obscure small masses. FINDINGS: Full field and magnification views of the RIGHT breast demonstrate heterogeneous and pleomorphic calcifications within the OUTER RIGHT breast spanning a distance of 5.5 cm. Many of these calcifications are in a linear orientation. IMPRESSION: Suspicious OUTER RIGHT breast calcifications spanning a distance of 5.5 cm. Tissue sampling of the anterior and posterior aspects of these calcifications recommended. RECOMMENDATION: Two site stereotactic guided RIGHT  breast biopsy, which will be arranged. I have discussed the findings and recommendations with the patient. If applicable, a reminder letter will be sent to the patient regarding the next appointment. BI-RADS CATEGORY  4: Suspicious. Electronically Signed   By: Margarette Canada M.D.   On: 05/12/2022 16:29    No orders of the defined types were placed in this encounter.   All questions were answered. The patient knows to call the clinic with any problems, questions or concerns. The total time spent in the appointment was 45 minutes.     Truitt Merle, MD 06/01/2022   I, Wilburn Mylar, am acting as scribe for Truitt Merle, MD.   I have reviewed the above documentation for accuracy and completeness, and I agree with the above.

## 2022-06-01 NOTE — Telephone Encounter (Signed)
Terri [name] was seen by a genetic counselor during the breast multidisciplinary clinic on 06/01/2022. In addition to her personal history of breast cancer, she reports four sisters with a history of lung cancer. She does not meet NCCN criteria for genetic testing at this time. She was still offered genetic counseling and testing but declined. We encourage her to contact us if there are any changes to her personal or family history of cancer. If she meets NCCN criteria based on the updated personal/family history, she would be recommended to have genetic counseling and testing.

## 2022-06-03 ENCOUNTER — Other Ambulatory Visit: Payer: Self-pay | Admitting: *Deleted

## 2022-06-03 ENCOUNTER — Other Ambulatory Visit: Payer: Self-pay | Admitting: General Surgery

## 2022-06-03 ENCOUNTER — Telehealth: Payer: Self-pay | Admitting: Radiation Oncology

## 2022-06-03 DIAGNOSIS — Z170 Estrogen receptor positive status [ER+]: Secondary | ICD-10-CM

## 2022-06-03 DIAGNOSIS — D0511 Intraductal carcinoma in situ of right breast: Secondary | ICD-10-CM

## 2022-06-03 NOTE — Telephone Encounter (Signed)
Called patient to schedule a consultation w. Alison. No answer, LVM for a return call.  

## 2022-06-05 ENCOUNTER — Encounter: Payer: Self-pay | Admitting: Hematology

## 2022-06-06 ENCOUNTER — Ambulatory Visit: Payer: Medicare Other

## 2022-06-06 ENCOUNTER — Ambulatory Visit: Payer: Medicare Other | Admitting: Radiation Oncology

## 2022-06-07 ENCOUNTER — Telehealth: Payer: Self-pay | Admitting: Medical Oncology

## 2022-06-07 NOTE — Telephone Encounter (Signed)
MTG-015 - Tissue and Bodily Fluids: Translational Medicine: Discovery and Evaluation of Biomarkers/Pharmacogenomics for the Diagnosis and Personalized Management of Patients    Outgoing call: [phone]  LVMOM with patient regarding follow-up call for above study. Research nurse, Donell Sievert, provided patient with study information last week, September 13th. Inquired with patient if she had any questions and interest in participation. Patient thanked, my call back number provided.  Maxwell Marion, RN, BSN, [place] Clinical Research Nurse Lead 06/07/2022 3:14 PM

## 2022-06-09 ENCOUNTER — Telehealth: Payer: Self-pay | Admitting: *Deleted

## 2022-06-09 ENCOUNTER — Encounter: Payer: Self-pay | Admitting: *Deleted

## 2022-06-09 NOTE — Telephone Encounter (Signed)
Spoke with patient to follow up from [facility] 9/13 and assess navigation needs. Patient denies any questions or concerns at this time. Encouraged her to call should anything arise. Patient verbalized understanding.

## 2022-06-10 ENCOUNTER — Telehealth: Payer: Self-pay | Admitting: Medical Oncology

## 2022-06-10 NOTE — Telephone Encounter (Signed)
MTG-015 - Tissue and Bodily Fluids: Translational Medicine: Discovery and Evaluation of Biomarkers/Pharmacogenomics for the Diagnosis and Personalized Management of Patients    Outgoing call: [phone] (2nd attempt)  LVMOM with patient following up to provide information and answer any questions patient may have. I informed patient that I will be out of the office next week and should she have any questions or interest in participating with study to please call research nurse Jeral Fruit, contact number provided. Patient was informed that should she be interested in participation, we would need to consent her and collect specimen prior to any treatment, including surgery.  Patient thanked.   Maxwell Marion, RN, BSN, [place] Clinical Research Nurse Lead 06/10/2022 2:09 PM

## 2022-06-15 NOTE — Pre-Procedure Instructions (Signed)
Surgical Instructions    Your procedure is scheduled on June 21, 2022.  Report to [facility] Main Entrance "A" at 5:30 A.M., then check in with the Admitting office.  Call this number if you have problems the morning of surgery:  [phone]   If you have any questions prior to your surgery date call [phone]: Open Monday-Friday 8am-4pm    Remember:  Do not eat after midnight the night before your surgery  You may drink clear liquids until 4:30 AM the morning of your surgery.   Clear liquids allowed are: Water, Non-Citrus Juices (without pulp), Carbonated Beverages, Clear Tea, Black Coffee Only (NO MILK, CREAM OR POWDERED CREAMER of any kind), and Gatorade.     Take these medicines the morning of surgery with A SIP OF WATER:  amLODipine (NORVASC)    Follow your surgeon's instructions on when to stop Aspirin.  If no instructions were given by your surgeon then you will need to call the office to get those instructions.     As of today, STOP taking any Aleve, Naproxen, Ibuprofen, Motrin, Advil, Goody's, BC's, all herbal medications, fish oil, and all vitamins.                     Do NOT Smoke (Tobacco/Vaping) for 24 hours prior to your procedure.  If you use a CPAP at night, you may bring your mask/headgear for your overnight stay.   Contacts, glasses, piercing's, hearing aid's, dentures or partials may not be worn into surgery, please bring cases for these belongings.    For patients admitted to the hospital, discharge time will be determined by your treatment team.   Patients discharged the day of surgery will not be allowed to drive home, and someone needs to stay with them for 24 hours.  SURGICAL WAITING ROOM VISITATION Patients having surgery or a procedure may have no more than 2 support people in the waiting area - these visitors may rotate.   Children under the age of 84 must have an adult with them who is not the patient. If the patient needs to stay at the  hospital during part of their recovery, the visitor guidelines for inpatient rooms apply. Pre-op nurse will coordinate an appropriate time for 1 support person to accompany patient in pre-op.  This support person may not rotate.   Please refer to the [facility] website for the visitor guidelines for Inpatients (after your surgery is over and you are in a regular room).    Special instructions:   [facility]- Preparing For Surgery  Before surgery, you can play an important role. Because skin is not sterile, your skin needs to be as free of germs as possible. You can reduce the number of germs on your skin by washing with CHG (chlorahexidine gluconate) Soap before surgery.  CHG is an antiseptic cleaner which kills germs and bonds with the skin to continue killing germs even after washing.    Oral Hygiene is also important to reduce your risk of infection.  Remember - BRUSH YOUR TEETH THE MORNING OF SURGERY WITH YOUR REGULAR TOOTHPASTE  Please do not use if you have an allergy to CHG or antibacterial soaps. If your skin becomes reddened/irritated stop using the CHG.  Do not shave (including legs and underarms) for at least 48 hours prior to first CHG shower. It is OK to shave your face.  Please follow these instructions carefully.   Shower the NIGHT BEFORE SURGERY and the MORNING OF SURGERY  If you chose to wash your hair, wash your hair first as usual with your normal shampoo.  After you shampoo, rinse your hair and body thoroughly to remove the shampoo.  Use CHG Soap as you would any other liquid soap. You can apply CHG directly to the skin and wash gently with a scrungie or a clean washcloth.   Apply the CHG Soap to your body ONLY FROM THE NECK DOWN.  Do not use on open wounds or open sores. Avoid contact with your eyes, ears, mouth and genitals (private parts). Wash Face and genitals (private parts)  with your normal soap.   Wash thoroughly, paying special attention to the area where  your surgery will be performed.  Thoroughly rinse your body with warm water from the neck down.  DO NOT shower/wash with your normal soap after using and rinsing off the CHG Soap.  Pat yourself dry with a CLEAN TOWEL.  Wear CLEAN PAJAMAS to bed the night before surgery  Place CLEAN SHEETS on your bed the night before your surgery  DO NOT SLEEP WITH PETS.   Day of Surgery: Take a shower with CHG soap. Do not wear jewelry or makeup Do not wear lotions, powders, perfumes/colognes, or deodorant. Do not shave 48 hours prior to surgery.  Men may shave face and neck. Do not bring valuables to the hospital.  [facility] is not responsible for any belongings or valuables. Do not wear nail polish, gel polish, artificial nails, or any other type of covering on natural nails (fingers and toes) If you have artificial nails or gel coating that need to be removed by a nail salon, please have this removed prior to surgery. Artificial nails or gel coating may interfere with anesthesia's ability to adequately monitor your vital signs.  Wear Clean/Comfortable clothing the morning of surgery Remember to brush your teeth WITH YOUR REGULAR TOOTHPASTE.   Please read over the following fact sheets that you were given.    If you received a COVID test during your pre-op visit  it is requested that you wear a mask when out in public, stay away from anyone that may not be feeling well and notify your surgeon if you develop symptoms. If you have been in contact with anyone that has tested positive in the last 10 days please notify you surgeon.

## 2022-06-16 ENCOUNTER — Encounter (HOSPITAL_COMMUNITY)
Admission: RE | Admit: 2022-06-16 | Discharge: 2022-06-16 | Disposition: A | Discharge status: Home / Self Care | Payer: Medicare Other | Source: Ambulatory Visit | Attending: General Surgery | Admitting: General Surgery

## 2022-06-16 ENCOUNTER — Encounter (HOSPITAL_COMMUNITY): Payer: Self-pay

## 2022-06-16 ENCOUNTER — Other Ambulatory Visit: Payer: Self-pay

## 2022-06-16 VITALS — BP 156/77 | HR 74 | Temp 98.2°F | Resp 17 | Ht 67.0 in | Wt 194.1 lb

## 2022-06-16 DIAGNOSIS — Z01810 Encounter for preprocedural cardiovascular examination: Secondary | ICD-10-CM | POA: Insufficient documentation

## 2022-06-16 DIAGNOSIS — Z01818 Encounter for other preprocedural examination: Secondary | ICD-10-CM

## 2022-06-16 NOTE — Progress Notes (Signed)
PCP - Dr. Donald Prose Cardiologist - denies  PPM/ICD - denies   Chest x-ray - denies EKG - 06/16/22 Stress Test - denies ECHO - denies Cardiac Cath - denies  Sleep Study - denies   DM- denies  ASA/Blood Thinner Instructions: n/a   ERAS Protcol - yes, no drink   COVID TEST- n/a   Anesthesia review: no  Patient denies shortness of breath, fever, cough and chest pain at PAT appointment   All instructions explained to the patient, with a verbal understanding of the material. Patient agrees to go over the instructions while at home for a better understanding. The opportunity to ask questions was provided.

## 2022-06-20 ENCOUNTER — Ambulatory Visit
Admission: RE | Admit: 2022-06-20 | Discharge: 2022-06-20 | Disposition: A | Discharge status: Home / Self Care | Payer: Medicare Other | Source: Ambulatory Visit | Attending: General Surgery | Admitting: General Surgery

## 2022-06-20 DIAGNOSIS — C50411 Malignant neoplasm of upper-outer quadrant of right female breast: Secondary | ICD-10-CM

## 2022-06-20 DIAGNOSIS — R928 Other abnormal and inconclusive findings on diagnostic imaging of breast: Secondary | ICD-10-CM | POA: Diagnosis not present

## 2022-06-20 NOTE — H&P (View-Only) (Signed)
REFERRING PHYSICIAN:  Nolon Nations, MD   PROVIDER:  Georgianne Fick, MD   Care Team: Patient Care Team: Laurann Montana, MD as PCP - General (Family Medicine) Georgianne Fick, MD as Consulting Provider (Surgical Oncology) Truitt Merle, MD (Hematology and Oncology) Marye Round, MD (Radiation Oncology) Huntley Dec, MD (Obstetrics and Gynecology)    MRN: [id number] DOB: 11-03-1950 DATE OF ENCOUNTER: 06/01/2022   Subjective    Chief Complaint: Right breast cancer   History of Present Illness: Terri [name] is a 71 y.o. female who is seen today as an office consultation at the request of Dr. Burr Medico for evaluation of right breast cancer.     Pt presents with a new diagnosis of right breast cancer 05/2022.  She had screening detected right breast calcifications.  Diagnostic imaging was performed.  This showed 5.5 cm of calcifications in the UOQ on the right.  Core needle biopsies were performed at anterior and posterior aspects of the calcs and both showed DCIS.  Both cores were positive for estrogen, but one of the cores was positive for PR and the other was negative.     Pt hasn't had surgery before this, but she has 4 sisters with lung cancer.     Menarche 12 Menopause ? Parity G2P2, age 13 for first   Work: retired.   Diagnostic mammogram: 05/12/2022 [facility] ACR Breast Density Category c: The breast tissue is heterogeneously dense, which may obscure small masses.   FINDINGS: Full field and magnification views of the RIGHT breast demonstrate heterogeneous and pleomorphic calcifications within the OUTER RIGHT breast spanning a distance of 5.5 cm. Many of these calcifications are in a linear orientation.   IMPRESSION: Suspicious OUTER RIGHT breast calcifications spanning a distance of 5.5 cm. Tissue sampling of the anterior and posterior aspects of these calcifications recommended.   RECOMMENDATION: Two site stereotactic guided RIGHT breast biopsy,  which will be arranged.   I have discussed the findings and recommendations with the patient. If applicable, a reminder letter will be sent to the patient regarding the next appointment.   BI-RADS CATEGORY  4: Suspicious.   Pathology core needle biopsy: 05/25/2022 1. 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 Vic Ripper REVIEWED THE CASE AND CONCURS WITH THE INTERPRETATION. A BREAST PROGNOSTIC PROFILE (ER AND PR) IS PENDING AND WILL BE REPORTED IN AN ADDENDUM. THE [facility] [place] WAS NOTIFIED ON 05/26/2022. 2. 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 Estrogen Receptor: 20%, POSITIVE, STRONG STAINING INTENSITY Progesterone Receptor: 0%, NEGATIVE     Review of Systems: A complete review of systems was obtained from the patient.  I have reviewed this information and discussed as appropriate with the patient.  See HPI as well for other ROS.   ROS- arthritis and back pain.         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   Ductal  carcinoma in situ (DCIS) of right breast   Family history of lung cancer      Past Surgical History       Past Surgical History:  Procedure Laterality Date   PHOTOREFRACTIVE KERATOTOMY/LASIK N/A      Unknown date   tubes tied N/A      Unknown date        Allergies  No Known Allergies           Current Outpatient Medications on File Prior to Visit  Medication Sig Dispense Refill   amLODIPine (NORVASC) 5 MG tablet Take 5 mg by mouth once daily       clobetasoL (TEMOVATE) 0.05 % cream Apply 1 Application  topically as needed       losartan (COZAAR) 100 MG tablet Take 100 mg by mouth once daily       multivitamin tablet Take 1 tablet by mouth once daily        No current facility-administered medications on file prior to visit.      Family History       Family History  Problem Relation Age of Onset   Stroke Mother     High blood pressure (Hypertension) Mother     Coronary Artery Disease (Blocked arteries around heart) Father     Diabetes Sister     High blood pressure (Hypertension) Sister     Lung cancer Sister          Social History       Tobacco Use  Smoking Status Never  Smokeless Tobacco Never      Social History  Social History         Socioeconomic History   Marital status: Single  Tobacco Use   Smoking status: Never   Smokeless tobacco: Never  Vaping Use   Vaping Use: Never used  Substance and Sexual Activity   Alcohol use: Yes      Comment: not often   Drug use: Never   Sexual activity: Defer        Objective:         Vitals:    06/01/22 1638  BP: (!) 159/79  Pulse: 89  Resp: 18  Temp: 36.6 C (97.9 F)  Weight: 87.7 kg (193 lb 6.4 oz)  Height: 170.2 cm ('5\' 7"'$ )    Body mass index is 30.29 kg/m.     Gen:  No acute distress.  Well nourished and well groomed.   Neurological: Alert and oriented to person, place, and time. Coordination normal.  Head: Normocephalic and atraumatic.  Eyes: Conjunctivae are normal. Pupils are  equal, round, and reactive to light. No scleral icterus.  Neck: Normal range of motion. Neck supple. No tracheal deviation or thyromegaly present.  Cardiovascular: Normal rate, regular rhythm, normal heart sounds and intact distal pulses.  Exam reveals no gallop and no friction rub.  No murmur heard. Breast: Pt with old bruising laterally on right breast at biopsy site. Right breast sl larger than left.  No palpable masses.  No nipple retraction, no nipple discharge.  No LAD.  No skin dimpling.   Respiratory: Effort normal.  No respiratory distress. No chest wall tenderness. Breath sounds normal.  No wheezes, rales or rhonchi.  GI:  Soft. Bowel sounds are normal. The abdomen is soft and nontender.  There is no rebound and no guarding.  Musculoskeletal: Normal range of motion. Extremities are nontender.  Lymphadenopathy: No cervical, preauricular, postauricular or axillary adenopathy is present Skin: Skin is warm and dry. No rash noted. No diaphoresis. No erythema. No pallor. No clubbing, cyanosis, or edema.   Psychiatric: Normal mood and affect. Behavior is normal. Judgment and thought content normal.      Labs Cbc and CMET essentially normal 06/01/2022   Assessment and Plan:        ICD-10-CM    1. Malignant neoplasm of upper-outer quadrant of right breast in female, estrogen receptor positive (CMS-HCC)  C50.411      Z17.0       2. Family history of lung cancer  Z80.1         Pt presents with a new diagnosis of cTis right breast cancer.    Pt desires breast conservation if possible.  Will plan seed bracketed lumpectomy.  Discussed with patient and husband.     The surgical procedure was described to the patient.  I discussed the incision type and location and that we would need radiology involved on with seed markers.    We discussed the risks bleeding, infection, damage to other structures, need for further procedures/surgeries.  We discussed the risk of seroma.  The patient was advised  if the breast has cancer, we may need to go back to surgery for additional tissue to obtain negative margins or for a lymph node biopsy. The patient was advised that these are the most common complications, but that others can occur as well. I discussed the risk of alteration in breast contour or size.  I discussed risk of chronic pain.  There are rare instances of heart/lung issues post op as well as blood clots.      They were advised against taking aspirin or other anti-inflammatory agents/blood thinners the week before surgery.     The risks and benefits of the procedure were described to the patient and she wishes to proceed.       No follow-ups on file.     Milus Height, MD FACS Surgical Oncology, General Surgery, Trauma and [facility] [place]

## 2022-06-20 NOTE — H&P (Signed)
REFERRING PHYSICIAN:  Nolon Nations, MD   PROVIDER:  Georgianne Fick, MD   Care Team: Patient Care Team: Laurann Montana, MD as PCP - General (Family Medicine) Georgianne Fick, MD as Consulting Provider (Surgical Oncology) Truitt Merle, MD (Hematology and Oncology) Marye Round, MD (Radiation Oncology) Huntley Dec, MD (Obstetrics and Gynecology)    MRN: [id number] DOB: 04-Apr-1951 DATE OF ENCOUNTER: 06/01/2022   Subjective    Chief Complaint: Right breast cancer   History of Present Illness: Terri [name] is a 71 y.o. female who is seen today as an office consultation at the request of Dr. Burr Medico for evaluation of right breast cancer.     Pt presents with a new diagnosis of right breast cancer 05/2022.  She had screening detected right breast calcifications.  Diagnostic imaging was performed.  This showed 5.5 cm of calcifications in the UOQ on the right.  Core needle biopsies were performed at anterior and posterior aspects of the calcs and both showed DCIS.  Both cores were positive for estrogen, but one of the cores was positive for PR and the other was negative.     Pt hasn't had surgery before this, but she has 4 sisters with lung cancer.     Menarche 12 Menopause ? Parity G2P2, age 86 for first   Work: retired.   Diagnostic mammogram: 05/12/2022 [facility] ACR Breast Density Category c: The breast tissue is heterogeneously dense, which may obscure small masses.   FINDINGS: Full field and magnification views of the RIGHT breast demonstrate heterogeneous and pleomorphic calcifications within the OUTER RIGHT breast spanning a distance of 5.5 cm. Many of these calcifications are in a linear orientation.   IMPRESSION: Suspicious OUTER RIGHT breast calcifications spanning a distance of 5.5 cm. Tissue sampling of the anterior and posterior aspects of these calcifications recommended.   RECOMMENDATION: Two site stereotactic guided RIGHT breast biopsy,  which will be arranged.   I have discussed the findings and recommendations with the patient. If applicable, a reminder letter will be sent to the patient regarding the next appointment.   BI-RADS CATEGORY  4: Suspicious.   Pathology core needle biopsy: 05/25/2022 1. 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 Vic Ripper REVIEWED THE CASE AND CONCURS WITH THE INTERPRETATION. A BREAST PROGNOSTIC PROFILE (ER AND PR) IS PENDING AND WILL BE REPORTED IN AN ADDENDUM. THE [facility] [place] WAS NOTIFIED ON 05/26/2022. 2. 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 Estrogen Receptor: 20%, POSITIVE, STRONG STAINING INTENSITY Progesterone Receptor: 0%, NEGATIVE     Review of Systems: A complete review of systems was obtained from the patient.  I have reviewed this information and discussed as appropriate with the patient.  See HPI as well for other ROS.   ROS- arthritis and back pain.         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   Ductal  carcinoma in situ (DCIS) of right breast   Family history of lung cancer      Past Surgical History       Past Surgical History:  Procedure Laterality Date   PHOTOREFRACTIVE KERATOTOMY/LASIK N/A      Unknown date   tubes tied N/A      Unknown date        Allergies  No Known Allergies           Current Outpatient Medications on File Prior to Visit  Medication Sig Dispense Refill   amLODIPine (NORVASC) 5 MG tablet Take 5 mg by mouth once daily       clobetasoL (TEMOVATE) 0.05 % cream Apply 1 Application  topically as needed       losartan (COZAAR) 100 MG tablet Take 100 mg by mouth once daily       multivitamin tablet Take 1 tablet by mouth once daily        No current facility-administered medications on file prior to visit.      Family History       Family History  Problem Relation Age of Onset   Stroke Mother     High blood pressure (Hypertension) Mother     Coronary Artery Disease (Blocked arteries around heart) Father     Diabetes Sister     High blood pressure (Hypertension) Sister     Lung cancer Sister          Social History       Tobacco Use  Smoking Status Never  Smokeless Tobacco Never      Social History  Social History         Socioeconomic History   Marital status: Single  Tobacco Use   Smoking status: Never   Smokeless tobacco: Never  Vaping Use   Vaping Use: Never used  Substance and Sexual Activity   Alcohol use: Yes      Comment: not often   Drug use: Never   Sexual activity: Defer        Objective:         Vitals:    06/01/22 1638  BP: (!) 159/79  Pulse: 89  Resp: 18  Temp: 36.6 C (97.9 F)  Weight: 87.7 kg (193 lb 6.4 oz)  Height: 170.2 cm ('5\' 7"'$ )    Body mass index is 30.29 kg/m.     Gen:  No acute distress.  Well nourished and well groomed.   Neurological: Alert and oriented to person, place, and time. Coordination normal.  Head: Normocephalic and atraumatic.  Eyes: Conjunctivae are normal. Pupils are  equal, round, and reactive to light. No scleral icterus.  Neck: Normal range of motion. Neck supple. No tracheal deviation or thyromegaly present.  Cardiovascular: Normal rate, regular rhythm, normal heart sounds and intact distal pulses.  Exam reveals no gallop and no friction rub.  No murmur heard. Breast: Pt with old bruising laterally on right breast at biopsy site. Right breast sl larger than left.  No palpable masses.  No nipple retraction, no nipple discharge.  No LAD.  No skin dimpling.   Respiratory: Effort normal.  No respiratory distress. No chest wall tenderness. Breath sounds normal.  No wheezes, rales or rhonchi.  GI:  Soft. Bowel sounds are normal. The abdomen is soft and nontender.  There is no rebound and no guarding.  Musculoskeletal: Normal range of motion. Extremities are nontender.  Lymphadenopathy: No cervical, preauricular, postauricular or axillary adenopathy is present Skin: Skin is warm and dry. No rash noted. No diaphoresis. No erythema. No pallor. No clubbing, cyanosis, or edema.   Psychiatric: Normal mood and affect. Behavior is normal. Judgment and thought content normal.      Labs Cbc and CMET essentially normal 06/01/2022   Assessment and Plan:        ICD-10-CM    1. Malignant neoplasm of upper-outer quadrant of right breast in female, estrogen receptor positive (CMS-HCC)  C50.411      Z17.0       2. Family history of lung cancer  Z80.1         Pt presents with a new diagnosis of cTis right breast cancer.    Pt desires breast conservation if possible.  Will plan seed bracketed lumpectomy.  Discussed with patient and husband.     The surgical procedure was described to the patient.  I discussed the incision type and location and that we would need radiology involved on with seed markers.    We discussed the risks bleeding, infection, damage to other structures, need for further procedures/surgeries.  We discussed the risk of seroma.  The patient was advised  if the breast has cancer, we may need to go back to surgery for additional tissue to obtain negative margins or for a lymph node biopsy. The patient was advised that these are the most common complications, but that others can occur as well. I discussed the risk of alteration in breast contour or size.  I discussed risk of chronic pain.  There are rare instances of heart/lung issues post op as well as blood clots.      They were advised against taking aspirin or other anti-inflammatory agents/blood thinners the week before surgery.     The risks and benefits of the procedure were described to the patient and she wishes to proceed.       No follow-ups on file.     Milus Height, MD FACS Surgical Oncology, General Surgery, Trauma and [facility] [place]

## 2022-06-20 NOTE — Anesthesia Preprocedure Evaluation (Addendum)
Anesthesia Evaluation  Patient identified by MRN, date of birth, ID band Patient awake    Reviewed: Allergy & Precautions, NPO status , Patient's Chart, lab work & pertinent test results  Airway Mallampati: III  TM Distance: >3 FB Neck ROM: Full    Dental  (+) Teeth Intact, Dental Advisory Given   Pulmonary neg pulmonary ROS,    Pulmonary exam normal breath sounds clear to auscultation       Cardiovascular hypertension, Pt. on medications Normal cardiovascular exam Rhythm:Regular Rate:Normal     Neuro/Psych negative neurological ROS     GI/Hepatic negative GI ROS, Neg liver ROS,   Endo/Other  Obesity   Renal/GU negative Renal ROS     Musculoskeletal negative musculoskeletal ROS (+)   Abdominal   Peds  Hematology negative hematology ROS (+)   Anesthesia Other Findings Right breast cancer   Reproductive/Obstetrics                            Anesthesia Physical Anesthesia Plan  ASA: 2  Anesthesia Plan: General   Post-op Pain Management: Tylenol PO (pre-op)*   Induction: Intravenous  PONV Risk Score and Plan: 3 and Dexamethasone, Ondansetron and Treatment may vary due to age or medical condition  Airway Management Planned: LMA  Additional Equipment:   Intra-op Plan:   Post-operative Plan: Extubation in OR  Informed Consent: I have reviewed the patients History and Physical, chart, labs and discussed the procedure including the risks, benefits and alternatives for the proposed anesthesia with the patient or authorized representative who has indicated his/her understanding and acceptance.     Dental advisory given  Plan Discussed with: CRNA  Anesthesia Plan Comments:        Anesthesia Quick Evaluation

## 2022-06-21 ENCOUNTER — Other Ambulatory Visit: Payer: Self-pay

## 2022-06-21 ENCOUNTER — Ambulatory Visit (HOSPITAL_COMMUNITY)
Admission: RE | Admit: 2022-06-21 | Discharge: 2022-06-21 | Disposition: A | Discharge status: Home / Self Care | Payer: Medicare Other | Attending: General Surgery | Admitting: General Surgery

## 2022-06-21 ENCOUNTER — Encounter (HOSPITAL_COMMUNITY): Payer: Self-pay | Admitting: General Surgery

## 2022-06-21 ENCOUNTER — Ambulatory Visit
Admission: RE | Admit: 2022-06-21 | Discharge: 2022-06-21 | Disposition: A | Discharge status: Home / Self Care | Payer: Medicare Other | Source: Ambulatory Visit | Attending: General Surgery | Admitting: General Surgery

## 2022-06-21 ENCOUNTER — Ambulatory Visit (HOSPITAL_BASED_OUTPATIENT_CLINIC_OR_DEPARTMENT_OTHER): Payer: Medicare Other | Admitting: Certified Registered Nurse Anesthetist

## 2022-06-21 ENCOUNTER — Ambulatory Visit (HOSPITAL_COMMUNITY): Payer: Medicare Other | Admitting: Certified Registered Nurse Anesthetist

## 2022-06-21 ENCOUNTER — Encounter (HOSPITAL_COMMUNITY)
Admission: RE | Disposition: A | Discharge status: Home / Self Care | Payer: Self-pay | Source: Home / Self Care | Attending: General Surgery

## 2022-06-21 DIAGNOSIS — C50911 Malignant neoplasm of unspecified site of right female breast: Secondary | ICD-10-CM

## 2022-06-21 DIAGNOSIS — I10 Essential (primary) hypertension: Secondary | ICD-10-CM | POA: Diagnosis not present

## 2022-06-21 DIAGNOSIS — N641 Fat necrosis of breast: Secondary | ICD-10-CM | POA: Diagnosis not present

## 2022-06-21 DIAGNOSIS — Z170 Estrogen receptor positive status [ER+]: Secondary | ICD-10-CM | POA: Insufficient documentation

## 2022-06-21 DIAGNOSIS — C50411 Malignant neoplasm of upper-outer quadrant of right female breast: Secondary | ICD-10-CM | POA: Diagnosis not present

## 2022-06-21 DIAGNOSIS — D0511 Intraductal carcinoma in situ of right breast: Secondary | ICD-10-CM | POA: Insufficient documentation

## 2022-06-21 DIAGNOSIS — R921 Mammographic calcification found on diagnostic imaging of breast: Secondary | ICD-10-CM | POA: Diagnosis not present

## 2022-06-21 DIAGNOSIS — R928 Other abnormal and inconclusive findings on diagnostic imaging of breast: Secondary | ICD-10-CM | POA: Diagnosis not present

## 2022-06-21 HISTORY — PX: BREAST LUMPECTOMY WITH RADIOACTIVE SEED LOCALIZATION: SHX6424

## 2022-06-21 SURGERY — BREAST LUMPECTOMY WITH RADIOACTIVE SEED LOCALIZATION
Anesthesia: General | Site: Breast | Laterality: Right

## 2022-06-21 MED ORDER — 0.9 % SODIUM CHLORIDE (POUR BTL) OPTIME
TOPICAL | Status: DC | PRN
  Administered 2022-06-21: 1000 mL

## 2022-06-21 MED ORDER — EPHEDRINE 5 MG/ML INJ
INTRAVENOUS | Status: AC
  Filled 2022-06-21: qty 5

## 2022-06-21 MED ORDER — CEFAZOLIN SODIUM-DEXTROSE 2-4 GM/100ML-% IV SOLN
2.0000 g | INTRAVENOUS | Status: AC
  Administered 2022-06-21: 2 g via INTRAVENOUS

## 2022-06-21 MED ORDER — PHENYLEPHRINE 80 MCG/ML (10ML) SYRINGE FOR IV PUSH (FOR BLOOD PRESSURE SUPPORT)
PREFILLED_SYRINGE | INTRAVENOUS | Status: DC | PRN
  Administered 2022-06-21 (×9): 80 ug via INTRAVENOUS

## 2022-06-21 MED ORDER — FENTANYL CITRATE (PF) 100 MCG/2ML IJ SOLN: 25.0000 ug | INTRAMUSCULAR | Status: DC | PRN

## 2022-06-21 MED ORDER — CEFAZOLIN SODIUM-DEXTROSE 2-4 GM/100ML-% IV SOLN
INTRAVENOUS | Status: AC
  Filled 2022-06-21: qty 100

## 2022-06-21 MED ORDER — ORAL CARE MOUTH RINSE: 15.0000 mL | Freq: Once | OROMUCOSAL | Status: AC

## 2022-06-21 MED ORDER — LIDOCAINE HCL 1 % IJ SOLN
INTRAMUSCULAR | Status: DC | PRN
  Administered 2022-06-21: 40 mL

## 2022-06-21 MED ORDER — LIDOCAINE 2% (20 MG/ML) 5 ML SYRINGE
INTRAMUSCULAR | Status: DC | PRN
  Administered 2022-06-21: 100 mg via INTRAVENOUS

## 2022-06-21 MED ORDER — CHLORHEXIDINE GLUCONATE 0.12 % MT SOLN
OROMUCOSAL | Status: AC
  Administered 2022-06-21: 15 mL via OROMUCOSAL
  Filled 2022-06-21: qty 15

## 2022-06-21 MED ORDER — PHENYLEPHRINE 80 MCG/ML (10ML) SYRINGE FOR IV PUSH (FOR BLOOD PRESSURE SUPPORT)
PREFILLED_SYRINGE | INTRAVENOUS | Status: AC
  Filled 2022-06-21: qty 10

## 2022-06-21 MED ORDER — LACTATED RINGERS IV SOLN: INTRAVENOUS | Status: DC

## 2022-06-21 MED ORDER — BUPIVACAINE-EPINEPHRINE (PF) 0.25% -1:200000 IJ SOLN
INTRAMUSCULAR | Status: AC
  Filled 2022-06-21: qty 30

## 2022-06-21 MED ORDER — ONDANSETRON HCL 4 MG/2ML IJ SOLN: 4.0000 mg | Freq: Once | INTRAMUSCULAR | Status: DC | PRN

## 2022-06-21 MED ORDER — ACETAMINOPHEN 500 MG PO TABS
ORAL_TABLET | ORAL | Status: AC
  Administered 2022-06-21: 1000 mg via ORAL
  Filled 2022-06-21: qty 2

## 2022-06-21 MED ORDER — LIDOCAINE HCL (PF) 1 % IJ SOLN
INTRAMUSCULAR | Status: AC
  Filled 2022-06-21: qty 30

## 2022-06-21 MED ORDER — CHLORHEXIDINE GLUCONATE CLOTH 2 % EX PADS: 6.0000 | MEDICATED_PAD | Freq: Once | CUTANEOUS | Status: DC

## 2022-06-21 MED ORDER — EPHEDRINE SULFATE-NACL 50-0.9 MG/10ML-% IV SOSY
PREFILLED_SYRINGE | INTRAVENOUS | Status: DC | PRN
  Administered 2022-06-21 (×5): 5 mg via INTRAVENOUS

## 2022-06-21 MED ORDER — ACETAMINOPHEN 500 MG PO TABS: 1000.0000 mg | ORAL_TABLET | ORAL | Status: AC

## 2022-06-21 MED ORDER — LIDOCAINE 2% (20 MG/ML) 5 ML SYRINGE
INTRAMUSCULAR | Status: AC
  Filled 2022-06-21: qty 5

## 2022-06-21 MED ORDER — PROPOFOL 10 MG/ML IV BOLUS
INTRAVENOUS | Status: AC
  Filled 2022-06-21: qty 20

## 2022-06-21 MED ORDER — CHLORHEXIDINE GLUCONATE 0.12 % MT SOLN: 15.0000 mL | Freq: Once | OROMUCOSAL | Status: AC

## 2022-06-21 MED ORDER — DEXAMETHASONE SODIUM PHOSPHATE 10 MG/ML IJ SOLN
INTRAMUSCULAR | Status: AC
  Filled 2022-06-21: qty 1

## 2022-06-21 MED ORDER — DEXAMETHASONE SODIUM PHOSPHATE 10 MG/ML IJ SOLN
INTRAMUSCULAR | Status: DC | PRN
  Administered 2022-06-21: 10 mg via INTRAVENOUS

## 2022-06-21 MED ORDER — ONDANSETRON HCL 4 MG/2ML IJ SOLN
INTRAMUSCULAR | Status: AC
  Filled 2022-06-21: qty 2

## 2022-06-21 MED ORDER — FENTANYL CITRATE (PF) 250 MCG/5ML IJ SOLN
INTRAMUSCULAR | Status: AC
  Filled 2022-06-21: qty 5

## 2022-06-21 MED ORDER — ONDANSETRON HCL 4 MG/2ML IJ SOLN
INTRAMUSCULAR | Status: DC | PRN
  Administered 2022-06-21: 4 mg via INTRAVENOUS

## 2022-06-21 MED ORDER — PROPOFOL 10 MG/ML IV BOLUS
INTRAVENOUS | Status: DC | PRN
  Administered 2022-06-21: 150 mg via INTRAVENOUS

## 2022-06-21 MED ORDER — FENTANYL CITRATE (PF) 250 MCG/5ML IJ SOLN
INTRAMUSCULAR | Status: DC | PRN
  Administered 2022-06-21 (×2): 25 ug via INTRAVENOUS

## 2022-06-21 MED ORDER — OXYCODONE HCL 5 MG PO TABS: 5.0000 mg | ORAL_TABLET | Freq: Four times a day (QID) | ORAL | 0 refills | Status: DC | PRN

## 2022-06-21 SURGICAL SUPPLY — 36 items
BAG COUNTER SPONGE SURGICOUNT (BAG) ×1 IMPLANT
CANISTER SUCT 3000ML PPV (MISCELLANEOUS) IMPLANT
CHLORAPREP W/TINT 26 (MISCELLANEOUS) ×1 IMPLANT
CLIP VESOCCLUDE LG 6/CT (CLIP) ×1 IMPLANT
COVER PROBE W GEL 5X96 (DRAPES) ×1 IMPLANT
COVER SURGICAL LIGHT HANDLE (MISCELLANEOUS) ×1 IMPLANT
DERMABOND ADVANCED .7 DNX12 (GAUZE/BANDAGES/DRESSINGS) ×1 IMPLANT
DEVICE DUBIN SPECIMEN MAMMOGRA (MISCELLANEOUS) ×1 IMPLANT
DRAPE CHEST BREAST 15X10 FENES (DRAPES) ×1 IMPLANT
ELECT COATED BLADE 2.86 ST (ELECTRODE) ×1 IMPLANT
ELECT REM PT RETURN 9FT ADLT (ELECTROSURGICAL) ×1
ELECTRODE REM PT RTRN 9FT ADLT (ELECTROSURGICAL) ×1 IMPLANT
GAUZE PAD ABD 8X10 STRL (GAUZE/BANDAGES/DRESSINGS) ×1 IMPLANT
GAUZE SPONGE 4X4 12PLY STRL LF (GAUZE/BANDAGES/DRESSINGS) ×1 IMPLANT
GLOVE BIO SURGEON STRL SZ 6 (GLOVE) ×1 IMPLANT
GLOVE INDICATOR 6.5 STRL GRN (GLOVE) ×1 IMPLANT
GOWN STRL REUS W/ TWL LRG LVL3 (GOWN DISPOSABLE) ×1 IMPLANT
GOWN STRL REUS W/TWL 2XL LVL3 (GOWN DISPOSABLE) ×1 IMPLANT
GOWN STRL REUS W/TWL LRG LVL3 (GOWN DISPOSABLE) ×1
KIT BASIN OR (CUSTOM PROCEDURE TRAY) ×1 IMPLANT
KIT MARKER MARGIN INK (KITS) ×1 IMPLANT
LIGHT WAVEGUIDE WIDE FLAT (MISCELLANEOUS) IMPLANT
NDL HYPO 25GX1X1/2 BEV (NEEDLE) ×1 IMPLANT
NEEDLE HYPO 25GX1X1/2 BEV (NEEDLE) ×1 IMPLANT
NS IRRIG 1000ML POUR BTL (IV SOLUTION) IMPLANT
PACK GENERAL/GYN (CUSTOM PROCEDURE TRAY) ×1 IMPLANT
STRIP CLOSURE SKIN 1/2X4 (GAUZE/BANDAGES/DRESSINGS) ×1 IMPLANT
STRIP CLOSURE SKIN 1/4X3 (GAUZE/BANDAGES/DRESSINGS) IMPLANT
SUT MNCRL AB 4-0 PS2 18 (SUTURE) ×1 IMPLANT
SUT VIC AB 2-0 SH 27 (SUTURE) ×1
SUT VIC AB 2-0 SH 27XBRD (SUTURE) ×1 IMPLANT
SUT VIC AB 3-0 SH 27 (SUTURE) ×1
SUT VIC AB 3-0 SH 27X BRD (SUTURE) ×1 IMPLANT
SYR CONTROL 10ML LL (SYRINGE) ×1 IMPLANT
TOWEL GREEN STERILE (TOWEL DISPOSABLE) ×1 IMPLANT
TOWEL GREEN STERILE FF (TOWEL DISPOSABLE) ×1 IMPLANT

## 2022-06-21 NOTE — Op Note (Signed)
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 The risks, benefits, complications, treatment options, and expected outcomes were discussed with the patient. The possibilities of bleeding, infection, the need for additional procedures, failure to diagnose a condition, and creating a complication requiring other procedures or operations were discussed with the patient. The patient concurred with the proposed plan, giving informed consent.  The site of surgery properly noted/marked. The patient was taken to Operating Room # 2, identified, and the procedure verified as right breast seed bracketed lumpectomy.  The right breast and chest were prepped and draped in standard fashion. A lateral circumareolar incision was made near the previously placed radioactive seed.  Dissection was carried down around the point of maximum signal intensity. The cautery was used to perform the dissection.   The specimen was inked with the margin marker paint kit.    Specimen radiography confirmed inclusion of the mammographic lesion, the clip, and the seed.  The background signal in the breast was zero.  Hemostasis was achieved with cautery.  The cavity was marked with clips on each border other than the anterior border.  The wound was irrigated and closed with 3-0 vicryl interrupted deep dermal sutures and 4-0 monocryl running subcuticular suture.      Sterile dressings were applied. At the end of the operation, all sponge, instrument, and needle counts were correct.    Findings: Seeds, clips in specimen.  Posterior margin is pectoralis.     Estimated Blood Loss:  min         Specimens: left breast tissue with seed         Complications:  None; patient tolerated the procedure well.         Disposition: PACU - hemodynamically stable.         Condition: stable

## 2022-06-21 NOTE — Interval H&P Note (Signed)
History and Physical Interval Note:  06/21/2022 7:34 AM  Terri [name]  has presented today for surgery, with the diagnosis of RIGHT BREAST CANCER.  The various methods of treatment have been discussed with the patient and family. After consideration of risks, benefits and other options for treatment, the patient has consented to  Procedure(s): RIGHT BREAST LUMPECTOMY WITH RADIOACTIVE SEED LOCALIZATION X2 (Right) as a surgical intervention.  The patient's history has been reviewed, patient examined, no change in status, stable for surgery.  I have reviewed the patient's chart and labs.  Questions were answered to the patient's satisfaction.     Stark Klein

## 2022-06-21 NOTE — Transfer of Care (Signed)
Immediate Anesthesia Transfer of Care Note  Patient: Terri [name]  Procedure(s) Performed: RIGHT BREAST LUMPECTOMY WITH RADIOACTIVE SEED LOCALIZATION X2 (Right: Breast)  Patient Location: PACU  Anesthesia Type:General  Level of Consciousness: awake, alert , and oriented  Airway & Oxygen Therapy: Patient Spontanous Breathing and Patient connected to face mask oxygen  Post-op Assessment: Report given to RN and Post -op Vital signs reviewed and stable  Post vital signs: Reviewed and stable  Last Vitals:  Vitals Value Taken Time  BP 97/77   Temp    Pulse 88   Resp 14   SpO2 98%     Last Pain:  Vitals:   06/21/22 0552  TempSrc: Oral  PainSc: 0-No pain         Complications: No notable events documented.

## 2022-06-21 NOTE — Anesthesia Procedure Notes (Signed)
Procedure Name: LMA Insertion Date/Time: 06/21/2022 7:54 AM  Performed by: Genelle Bal, CRNAPre-anesthesia Checklist: Patient identified, Emergency Drugs available, Suction available and Patient being monitored Patient Re-evaluated:Patient Re-evaluated prior to induction Oxygen Delivery Method: Circle system utilized Preoxygenation: Pre-oxygenation with 100% oxygen Induction Type: IV induction Ventilation: Mask ventilation without difficulty LMA: LMA inserted LMA Size: 4.0 Number of attempts: 1 Placement Confirmation: positive ETCO2 and breath sounds checked- equal and bilateral Tube secured with: Tape Dental Injury: Teeth and Oropharynx as per pre-operative assessment  Comments: Performed by Encarnacion Chu, SRNA

## 2022-06-21 NOTE — Discharge Instructions (Addendum)
[facility] Office Phone Number [phone]  BREAST BIOPSY/ PARTIAL MASTECTOMY: POST OP INSTRUCTIONS  Always review your discharge instruction sheet given to you by the facility where your surgery was performed.  IF YOU HAVE DISABILITY OR FAMILY LEAVE FORMS, YOU MUST BRING THEM TO THE OFFICE FOR PROCESSING.  DO NOT GIVE THEM TO YOUR DOCTOR.  Take 2 tylenol (acetominophen) three times a day for 3 days.  If you still have pain, add ibuprofen with food in between if able to take this (if you have kidney issues or stomach issues, do not take ibuprofen).  If both of those are not enough, add the narcotic pain pill.  If you find you are needing a lot of this overnight after surgery, call the next morning for a refill.    Prescriptions will not be filled after 5pm or on week-ends. Take your usually prescribed medications unless otherwise directed You should eat very light the first 24 hours after surgery, such as soup, crackers, pudding, etc.  Resume your normal diet the day after surgery. Most patients will experience some swelling and bruising in the breast.  Ice packs and a good support bra will help.  Swelling and bruising can take several days to resolve.  It is common to experience some constipation if taking pain medication after surgery.  Increasing fluid intake and taking a stool softener will usually help or prevent this problem from occurring.  A mild laxative (Milk of Magnesia or Miralax) should be taken according to package directions if there are no bowel movements after 48 hours. Unless discharge instructions indicate otherwise, you may remove your bandages 48 hours after surgery, and you may shower at that time.  You may have steri-strips (small skin tapes) in place directly over the incision.  These strips should be left on the skin at least for for 7-10 days.    ACTIVITIES:  You may resume regular daily activities (gradually increasing) beginning the next day.  Wearing a  good support bra or sports bra (or the breast binder) minimizes pain and swelling.  You may have sexual intercourse when it is comfortable. No heavy lifting for 1-2 weeks (not over around 10 pounds).  You may drive when you no longer are taking prescription pain medication, you can comfortably wear a seatbelt, and you can safely maneuver your car and apply brakes. RETURN TO WORK:  __________3-14 days depending on job. _______________ You should see your doctor in the office for a follow-up appointment approximately two weeks after your surgery.  Your doctor's nurse will typically make your follow-up appointment when she calls you with your pathology report.  Expect your pathology report 3-4 business days after your surgery.  You may call to check if you do not hear from us after three days.   WHEN TO CALL YOUR DOCTOR: Fever over 101.0 Nausea and/or vomiting. Extreme swelling or bruising. Continued bleeding from incision. Increased pain, redness, or drainage from the incision.  The clinic staff is available to answer your questions during regular business hours.  Please don't hesitate to call and ask to speak to one of the nurses for clinical concerns.  If you have a medical emergency, go to the nearest emergency room or call 911.  A surgeon from [facility] is always on call at the hospital.  For further questions, please visit [email or web address]   

## 2022-06-22 ENCOUNTER — Encounter (HOSPITAL_COMMUNITY): Payer: Self-pay | Admitting: General Surgery

## 2022-06-22 NOTE — Anesthesia Postprocedure Evaluation (Signed)
Anesthesia Post Note  Patient: Terri [name]  Procedure(s) Performed: RIGHT BREAST LUMPECTOMY WITH RADIOACTIVE SEED LOCALIZATION X2 (Right: Breast)     Patient location during evaluation: PACU Anesthesia Type: General Level of consciousness: awake and alert Pain management: pain level controlled Vital Signs Assessment: post-procedure vital signs reviewed and stable Respiratory status: spontaneous breathing, nonlabored ventilation, respiratory function stable and patient connected to nasal cannula oxygen Cardiovascular status: blood pressure returned to baseline and stable Postop Assessment: no apparent nausea or vomiting Anesthetic complications: no   No notable events documented.  Last Vitals:  Vitals:   06/21/22 0915 06/21/22 0930  BP: (!) 124/58 125/60  Pulse: 81 76  Resp: 14 14  Temp:  36.5 C  SpO2: 99% 94%    Last Pain:  Vitals:   06/21/22 0930  TempSrc:   PainSc: 0-No pain                 Santa Lighter

## 2022-06-23 DIAGNOSIS — C50911 Malignant neoplasm of unspecified site of right female breast: Secondary | ICD-10-CM | POA: Diagnosis not present

## 2022-06-23 LAB — SURGICAL PATHOLOGY

## 2022-06-24 ENCOUNTER — Other Ambulatory Visit: Payer: Self-pay | Admitting: General Surgery

## 2022-06-24 ENCOUNTER — Telehealth: Payer: Self-pay | Admitting: General Surgery

## 2022-06-24 NOTE — Telephone Encounter (Signed)
Left message that path was all stage 0 but margin positive.  Will need reexcision (second surgery) but no seeds placed this time.

## 2022-06-27 ENCOUNTER — Encounter (HOSPITAL_BASED_OUTPATIENT_CLINIC_OR_DEPARTMENT_OTHER): Payer: Self-pay | Admitting: General Surgery

## 2022-06-27 ENCOUNTER — Encounter: Payer: Self-pay | Admitting: *Deleted

## 2022-06-27 ENCOUNTER — Other Ambulatory Visit: Payer: Self-pay

## 2022-06-27 ENCOUNTER — Telehealth: Payer: Self-pay | Admitting: Radiation Oncology

## 2022-06-27 NOTE — Telephone Encounter (Signed)
Called patient to reschedule appointment with Terri [name] 11/8. No answer, LVM for a return call.

## 2022-06-28 ENCOUNTER — Encounter (HOSPITAL_COMMUNITY): Payer: Self-pay

## 2022-07-05 ENCOUNTER — Other Ambulatory Visit: Payer: Self-pay

## 2022-07-05 ENCOUNTER — Ambulatory Visit (HOSPITAL_BASED_OUTPATIENT_CLINIC_OR_DEPARTMENT_OTHER): Payer: Medicare Other | Admitting: Anesthesiology

## 2022-07-05 ENCOUNTER — Ambulatory Visit (HOSPITAL_BASED_OUTPATIENT_CLINIC_OR_DEPARTMENT_OTHER)
Admission: RE | Admit: 2022-07-05 | Discharge: 2022-07-05 | Disposition: A | Discharge status: Home / Self Care | Payer: Medicare Other | Attending: General Surgery | Admitting: General Surgery

## 2022-07-05 ENCOUNTER — Encounter (HOSPITAL_BASED_OUTPATIENT_CLINIC_OR_DEPARTMENT_OTHER)
Admission: RE | Disposition: A | Discharge status: Home / Self Care | Payer: Self-pay | Source: Home / Self Care | Attending: General Surgery

## 2022-07-05 ENCOUNTER — Encounter (HOSPITAL_BASED_OUTPATIENT_CLINIC_OR_DEPARTMENT_OTHER): Payer: Self-pay | Admitting: General Surgery

## 2022-07-05 DIAGNOSIS — N641 Fat necrosis of breast: Secondary | ICD-10-CM | POA: Diagnosis not present

## 2022-07-05 DIAGNOSIS — Z79899 Other long term (current) drug therapy: Secondary | ICD-10-CM | POA: Diagnosis not present

## 2022-07-05 DIAGNOSIS — C50911 Malignant neoplasm of unspecified site of right female breast: Secondary | ICD-10-CM

## 2022-07-05 DIAGNOSIS — D0511 Intraductal carcinoma in situ of right breast: Secondary | ICD-10-CM | POA: Diagnosis not present

## 2022-07-05 DIAGNOSIS — Z01818 Encounter for other preprocedural examination: Secondary | ICD-10-CM

## 2022-07-05 DIAGNOSIS — I10 Essential (primary) hypertension: Secondary | ICD-10-CM | POA: Insufficient documentation

## 2022-07-05 DIAGNOSIS — C50411 Malignant neoplasm of upper-outer quadrant of right female breast: Secondary | ICD-10-CM | POA: Diagnosis not present

## 2022-07-05 HISTORY — PX: RE-EXCISION OF BREAST LUMPECTOMY: SHX6048

## 2022-07-05 SURGERY — EXCISION, LESION, BREAST
Anesthesia: General | Site: Breast | Laterality: Right

## 2022-07-05 MED ORDER — LACTATED RINGERS IV SOLN: INTRAVENOUS | Status: DC

## 2022-07-05 MED ORDER — CELECOXIB 200 MG PO CAPS
ORAL_CAPSULE | ORAL | Status: AC
  Filled 2022-07-05: qty 1

## 2022-07-05 MED ORDER — DEXAMETHASONE SODIUM PHOSPHATE 10 MG/ML IJ SOLN
INTRAMUSCULAR | Status: AC
  Filled 2022-07-05: qty 1

## 2022-07-05 MED ORDER — PROPOFOL 10 MG/ML IV BOLUS
INTRAVENOUS | Status: DC | PRN
  Administered 2022-07-05: 150 mg via INTRAVENOUS

## 2022-07-05 MED ORDER — ONDANSETRON HCL 4 MG/2ML IJ SOLN
INTRAMUSCULAR | Status: DC | PRN
  Administered 2022-07-05: 4 mg via INTRAVENOUS

## 2022-07-05 MED ORDER — CHLORHEXIDINE GLUCONATE CLOTH 2 % EX PADS: 6.0000 | MEDICATED_PAD | Freq: Once | CUTANEOUS | Status: DC

## 2022-07-05 MED ORDER — CEFAZOLIN SODIUM-DEXTROSE 2-4 GM/100ML-% IV SOLN
2.0000 g | INTRAVENOUS | Status: AC
  Administered 2022-07-05: 2 g via INTRAVENOUS

## 2022-07-05 MED ORDER — LIDOCAINE HCL (CARDIAC) PF 100 MG/5ML IV SOSY
PREFILLED_SYRINGE | INTRAVENOUS | Status: DC | PRN
  Administered 2022-07-05: 60 mg via INTRAVENOUS

## 2022-07-05 MED ORDER — LIDOCAINE 2% (20 MG/ML) 5 ML SYRINGE
INTRAMUSCULAR | Status: AC
  Filled 2022-07-05: qty 5

## 2022-07-05 MED ORDER — ACETAMINOPHEN 500 MG PO TABS
1000.0000 mg | ORAL_TABLET | Freq: Once | ORAL | Status: AC
  Administered 2022-07-05: 1000 mg via ORAL

## 2022-07-05 MED ORDER — FENTANYL CITRATE (PF) 100 MCG/2ML IJ SOLN: 25.0000 ug | INTRAMUSCULAR | Status: DC | PRN

## 2022-07-05 MED ORDER — FENTANYL CITRATE (PF) 100 MCG/2ML IJ SOLN
INTRAMUSCULAR | Status: AC
  Filled 2022-07-05: qty 2

## 2022-07-05 MED ORDER — ACETAMINOPHEN 500 MG PO TABS
ORAL_TABLET | ORAL | Status: AC
  Filled 2022-07-05: qty 2

## 2022-07-05 MED ORDER — DEXAMETHASONE SODIUM PHOSPHATE 4 MG/ML IJ SOLN
INTRAMUSCULAR | Status: DC | PRN
  Administered 2022-07-05: 5 mg via INTRAVENOUS

## 2022-07-05 MED ORDER — CEFAZOLIN SODIUM-DEXTROSE 2-4 GM/100ML-% IV SOLN
INTRAVENOUS | Status: AC
  Filled 2022-07-05: qty 100

## 2022-07-05 MED ORDER — ATROPINE SULFATE 0.4 MG/ML IV SOLN
INTRAVENOUS | Status: AC
  Filled 2022-07-05: qty 1

## 2022-07-05 MED ORDER — FENTANYL CITRATE (PF) 100 MCG/2ML IJ SOLN
INTRAMUSCULAR | Status: DC | PRN
  Administered 2022-07-05: 50 ug via INTRAVENOUS

## 2022-07-05 MED ORDER — PHENYLEPHRINE 80 MCG/ML (10ML) SYRINGE FOR IV PUSH (FOR BLOOD PRESSURE SUPPORT)
PREFILLED_SYRINGE | INTRAVENOUS | Status: AC
  Filled 2022-07-05: qty 10

## 2022-07-05 MED ORDER — SUCCINYLCHOLINE CHLORIDE 200 MG/10ML IV SOSY
PREFILLED_SYRINGE | INTRAVENOUS | Status: AC
  Filled 2022-07-05: qty 10

## 2022-07-05 MED ORDER — ONDANSETRON HCL 4 MG/2ML IJ SOLN
INTRAMUSCULAR | Status: AC
  Filled 2022-07-05: qty 2

## 2022-07-05 MED ORDER — MIDAZOLAM HCL 2 MG/2ML IJ SOLN
INTRAMUSCULAR | Status: AC
  Filled 2022-07-05: qty 2

## 2022-07-05 MED ORDER — ACETAMINOPHEN 500 MG PO TABS: 1000.0000 mg | ORAL_TABLET | ORAL | Status: AC

## 2022-07-05 MED ORDER — CELECOXIB 200 MG PO CAPS
200.0000 mg | ORAL_CAPSULE | Freq: Once | ORAL | Status: AC
  Administered 2022-07-05: 200 mg via ORAL

## 2022-07-05 MED ORDER — 0.9 % SODIUM CHLORIDE (POUR BTL) OPTIME
TOPICAL | Status: DC | PRN
  Administered 2022-07-05: 75 mL

## 2022-07-05 MED ORDER — LIDOCAINE-EPINEPHRINE (PF) 1 %-1:200000 IJ SOLN
INTRAMUSCULAR | Status: DC | PRN
  Administered 2022-07-05: 40 mL

## 2022-07-05 MED ORDER — EPHEDRINE SULFATE (PRESSORS) 50 MG/ML IJ SOLN
INTRAMUSCULAR | Status: DC | PRN
  Administered 2022-07-05: 10 mg via INTRAVENOUS

## 2022-07-05 MED ORDER — EPHEDRINE 5 MG/ML INJ
INTRAVENOUS | Status: AC
  Filled 2022-07-05: qty 5

## 2022-07-05 MED ORDER — MIDAZOLAM HCL 5 MG/5ML IJ SOLN
INTRAMUSCULAR | Status: DC | PRN
  Administered 2022-07-05: 1 mg via INTRAVENOUS

## 2022-07-05 SURGICAL SUPPLY — 52 items
ADH SKN CLS APL DERMABOND .7 (GAUZE/BANDAGES/DRESSINGS) ×1
APL PRP STRL LF DISP 70% ISPRP (MISCELLANEOUS) ×1
BINDER BREAST 3XL (GAUZE/BANDAGES/DRESSINGS) IMPLANT
BINDER BREAST LRG (GAUZE/BANDAGES/DRESSINGS) IMPLANT
BINDER BREAST MEDIUM (GAUZE/BANDAGES/DRESSINGS) IMPLANT
BINDER BREAST XLRG (GAUZE/BANDAGES/DRESSINGS) IMPLANT
BINDER BREAST XXLRG (GAUZE/BANDAGES/DRESSINGS) IMPLANT
BLADE SURG 10 STRL SS (BLADE) ×1 IMPLANT
BLADE SURG 15 STRL LF DISP TIS (BLADE) IMPLANT
BLADE SURG 15 STRL SS (BLADE)
CANISTER SUCT 1200ML W/VALVE (MISCELLANEOUS) ×1 IMPLANT
CHLORAPREP W/TINT 26 (MISCELLANEOUS) ×1 IMPLANT
CLIP TI LARGE 6 (CLIP) ×1 IMPLANT
COVER BACK TABLE 60X90IN (DRAPES) ×1 IMPLANT
COVER MAYO STAND STRL (DRAPES) ×1 IMPLANT
DERMABOND ADVANCED .7 DNX12 (GAUZE/BANDAGES/DRESSINGS) ×1 IMPLANT
DRAPE LAPAROSCOPIC ABDOMINAL (DRAPES) ×1 IMPLANT
DRAPE UTILITY XL STRL (DRAPES) ×1 IMPLANT
ELECT COATED BLADE 2.86 ST (ELECTRODE) ×1 IMPLANT
ELECT REM PT RETURN 9FT ADLT (ELECTROSURGICAL) ×1
ELECTRODE REM PT RTRN 9FT ADLT (ELECTROSURGICAL) ×1 IMPLANT
GAUZE SPONGE 4X4 12PLY STRL (GAUZE/BANDAGES/DRESSINGS) IMPLANT
GAUZE SPONGE 4X4 12PLY STRL LF (GAUZE/BANDAGES/DRESSINGS) ×1 IMPLANT
GLOVE BIO SURGEON STRL SZ 6 (GLOVE) ×1 IMPLANT
GLOVE BIOGEL PI IND STRL 6.5 (GLOVE) ×1 IMPLANT
GLOVE BIOGEL PI IND STRL 7.0 (GLOVE) IMPLANT
GLOVE SURG SS PI 6.5 STRL IVOR (GLOVE) IMPLANT
GOWN STRL REUS W/ TWL LRG LVL3 (GOWN DISPOSABLE) ×1 IMPLANT
GOWN STRL REUS W/TWL 2XL LVL3 (GOWN DISPOSABLE) ×1 IMPLANT
GOWN STRL REUS W/TWL LRG LVL3 (GOWN DISPOSABLE) ×1
KIT MARKER MARGIN INK (KITS) ×1 IMPLANT
NDL HYPO 25X1 1.5 SAFETY (NEEDLE) ×1 IMPLANT
NEEDLE HYPO 25X1 1.5 SAFETY (NEEDLE) ×1 IMPLANT
NS IRRIG 1000ML POUR BTL (IV SOLUTION) ×1 IMPLANT
PACK BASIN DAY SURGERY FS (CUSTOM PROCEDURE TRAY) ×1 IMPLANT
PENCIL SMOKE EVACUATOR (MISCELLANEOUS) ×1 IMPLANT
SLEEVE SCD COMPRESS KNEE MED (STOCKING) ×1 IMPLANT
SPIKE FLUID TRANSFER (MISCELLANEOUS) IMPLANT
SPONGE T-LAP 18X18 ~~LOC~~+RFID (SPONGE) ×1 IMPLANT
STAPLER VISISTAT 35W (STAPLE) IMPLANT
STRIP CLOSURE SKIN 1/2X4 (GAUZE/BANDAGES/DRESSINGS) ×1 IMPLANT
SUT MON AB 4-0 PC3 18 (SUTURE) ×1 IMPLANT
SUT SILK 2 0 SH (SUTURE) IMPLANT
SUT VIC AB 3-0 54X BRD REEL (SUTURE) IMPLANT
SUT VIC AB 3-0 BRD 54 (SUTURE)
SUT VIC AB 3-0 SH 27 (SUTURE) ×1
SUT VIC AB 3-0 SH 27X BRD (SUTURE) ×1 IMPLANT
SYR BULB EAR ULCER 3OZ GRN STR (SYRINGE) ×1 IMPLANT
SYR CONTROL 10ML LL (SYRINGE) ×1 IMPLANT
TOWEL GREEN STERILE FF (TOWEL DISPOSABLE) ×1 IMPLANT
TUBE CONNECTING 20X1/4 (TUBING) ×1 IMPLANT
YANKAUER SUCT BULB TIP NO VENT (SUCTIONS) ×1 IMPLANT

## 2022-07-05 NOTE — Anesthesia Preprocedure Evaluation (Addendum)
Anesthesia Evaluation  Patient identified by MRN, date of birth, ID band Patient awake    Reviewed: Allergy & Precautions, NPO status , Patient's Chart, lab work & pertinent test results  Airway Mallampati: II  TM Distance: >3 FB Neck ROM: Full    Dental no notable dental hx.    Pulmonary neg pulmonary ROS,    Pulmonary exam normal        Cardiovascular hypertension, Pt. on medications  Rhythm:Regular Rate:Normal     Neuro/Psych negative neurological ROS  negative psych ROS   GI/Hepatic negative GI ROS, Neg liver ROS,   Endo/Other  negative endocrine ROS  Renal/GU negative Renal ROS  negative genitourinary   Musculoskeletal Right breast Ca   Abdominal Normal abdominal exam  (+)   Peds  Hematology negative hematology ROS (+)   Anesthesia Other Findings   Reproductive/Obstetrics                             Anesthesia Physical Anesthesia Plan  ASA: 2  Anesthesia Plan: General   Post-op Pain Management:    Induction: Intravenous  PONV Risk Score and Plan: 3 and Ondansetron, Dexamethasone and Treatment may vary due to age or medical condition  Airway Management Planned: Mask and LMA  Additional Equipment: None  Intra-op Plan:   Post-operative Plan: Extubation in OR  Informed Consent: I have reviewed the patients History and Physical, chart, labs and discussed the procedure including the risks, benefits and alternatives for the proposed anesthesia with the patient or authorized representative who has indicated his/her understanding and acceptance.     Dental advisory given  Plan Discussed with: CRNA  Anesthesia Plan Comments:         Anesthesia Quick Evaluation

## 2022-07-05 NOTE — Discharge Instructions (Addendum)
Terri [name] Office Phone Number [phone]  BREAST BIOPSY/ PARTIAL MASTECTOMY: POST OP INSTRUCTIONS  Always review your discharge instruction sheet given to you by the facility where your surgery was performed.  IF YOU HAVE DISABILITY OR FAMILY LEAVE FORMS, YOU MUST BRING THEM TO THE OFFICE FOR PROCESSING.  DO NOT GIVE THEM TO YOUR DOCTOR.  Take 2 tylenol (acetominophen) three times a day for 3 days.  If you still have pain, add ibuprofen with food in between if able to take this (if you have kidney issues or stomach issues, do not take ibuprofen).  If both of those are not enough, add the narcotic pain pill.  If you find you are needing a lot of this overnight after surgery, call the next morning for a refill.    May have Tylenol/ Ibuporfen starting today, 07/05/2022 after 1:40 PM.    Prescriptions will not be filled after 5pm or on week-ends. Take your usually prescribed medications unless otherwise directed You should eat very light the first 24 hours after surgery, such as soup, crackers, pudding, etc.  Resume your normal diet the day after surgery. Most patients will experience some swelling and bruising in the breast.  Ice packs and a good support bra will help.  Swelling and bruising can take several days to resolve.  It is common to experience some constipation if taking pain medication after surgery.  Increasing fluid intake and taking a stool softener will usually help or prevent this problem from occurring.  A mild laxative (Milk of Magnesia or Miralax) should be taken according to package directions if there are no bowel movements after 48 hours. Unless discharge instructions indicate otherwise, you may remove your bandages 48 hours after surgery, and you may shower at that time.  You may have steri-strips (small skin tapes) in place directly over the incision.  These strips should be left on the skin at least for for 7-10 days.    ACTIVITIES:  You may resume regular  daily activities (gradually increasing) beginning the next day.  Wearing a good support bra or sports bra (or the breast binder) minimizes pain and swelling.  You may have sexual intercourse when it is comfortable. No heavy lifting for 1-2 weeks (not over around 10 pounds).  You may drive when you no longer are taking prescription pain medication, you can comfortably wear a seatbelt, and you can safely maneuver your car and apply brakes. RETURN TO WORK:  __________3-14 days depending on job. _______________ Dennis Bast should see your doctor in the office for a follow-up appointment approximately two weeks after your surgery.  Your doctor's nurse will typically make your follow-up appointment when she calls you with your pathology report.  Expect your pathology report 3-4 business days after your surgery.  You may call to check if you do not hear from [place] after three days.   WHEN TO CALL YOUR DOCTOR: Fever over 101.0 Nausea and/or vomiting. Extreme swelling or bruising. Continued bleeding from incision. Increased pain, redness, or drainage from the incision.  The clinic staff is available to answer your questions during regular business hours.  Please don't hesitate to call and ask to speak to one of the nurses for clinical concerns.  If you have a medical emergency, go to the nearest emergency room or call 911.  A surgeon from [facility] is always on call at the hospital.  For further questions, please visit [email or web address]    Post Anesthesia Home Care Instructions  Activity: Get plenty  of rest for the remainder of the day. A responsible individual must stay with you for 24 hours following the procedure.  For the next 24 hours, DO NOT: -Drive a car -Paediatric nurse -Drink alcoholic beverages -Take any medication unless instructed by your physician -Make any legal decisions or sign important papers.  Meals: Start with liquid foods such as gelatin or soup. Progress to  regular foods as tolerated. Avoid greasy, spicy, heavy foods. If nausea and/or vomiting occur, drink only clear liquids until the nausea and/or vomiting subsides. Call your physician if vomiting continues.  Special Instructions/Symptoms: Your throat may feel dry or sore from the anesthesia or the breathing tube placed in your throat during surgery. If this causes discomfort, gargle with warm salt water. The discomfort should disappear within 24 hours.  If you had a scopolamine patch placed behind your ear for the management of post- operative nausea and/or vomiting:  1. The medication in the patch is effective for 72 hours, after which it should be removed.  Wrap patch in a tissue and discard in the trash. Wash hands thoroughly with soap and water. 2. You may remove the patch earlier than 72 hours if you experience unpleasant side effects which may include dry mouth, dizziness or visual disturbances. 3. Avoid touching the patch. Wash your hands with soap and water after contact with the patch.

## 2022-07-05 NOTE — Transfer of Care (Signed)
Immediate Anesthesia Transfer of Care Note  Patient: Terri [name]  Procedure(s) Performed: RE-EXCISION OF RIGHT BREAST LUMPECTOMY (Right: Breast)  Patient Location: PACU  Anesthesia Type:General  Level of Consciousness: awake, alert , oriented, drowsy and patient cooperative  Airway & Oxygen Therapy: Patient Spontanous Breathing and Patient connected to face mask oxygen  Post-op Assessment: Report given to RN and Post -op Vital signs reviewed and stable  Post vital signs: Reviewed and stable  Last Vitals:  Vitals Value Taken Time  BP    Temp    Pulse 94 07/05/22 0955  Resp    SpO2 98 % 07/05/22 0955  Vitals shown include unvalidated device data.  Last Pain:  Vitals:   07/05/22 0734  TempSrc: Oral  PainSc: 0-No pain         Complications: No notable events documented.

## 2022-07-05 NOTE — Op Note (Signed)
Re-excisional Right Breast Lumpectomy   Indications: This patient presents with history of positive margins after partial mastectomy for right breast cancer   Pre-operative Diagnosis: right breast cancer   Post-operative Diagnosis: right breast cancer   Surgeon: Stark Klein   Assistants: n/a   Anesthesia: General anesthesia and Local anesthesia  ASA Class: 2   Procedure Details  The patient was seen in the Holding Room. The risks, benefits, complications, treatment options, and expected outcomes were discussed with the patient. The possibilities of reaction to medication, pulmonary aspiration, bleeding, infection, the need for additional procedures, failure to diagnose a condition, and creating a complication requiring transfusion or operation were discussed with the patient. The patient concurred with the proposed plan, giving informed consent. The site of surgery properly noted/marked. The patient was taken to Operating Room # 1, identified, and the procedure verified as re-excision of right breast cancer.  After induction of anesthesia, the right breast and chest were prepped and draped in standard fashion.  The lumpectomy was performed by reopening the prior incision. Seroma was aspirated. The mastopexy sutures were removed. Additional margin was taken at the inferior border of the partial mastectomy cavity. This was marked with the margin marker paint kit. Hemostasis was achieved with cautery. Local anesthetic was infiltrated into the breast.  The wound was irrigated and closed with a 3-0 Vicryl deep dermal interrupted and a 4-0 Monocryl subcuticular closure in layers.  Sterile dressings were applied. At the end of the operation, all sponge, instrument, and needle counts were correct.   Findings:  grossly clear surgical margins, posterior margin is pectoralis  Estimated Blood Loss: Minimal   Drains: none   Specimens: additional inferior margin  Complications: None; patient  tolerated the procedure well.   Disposition: PACU - hemodynamically stable.   Condition: stable

## 2022-07-05 NOTE — Interval H&P Note (Signed)
History and Physical Interval Note:  07/05/2022 8:38 AM  Terri [name]  has presented today for surgery, with the diagnosis of RIGHT BREAST CANCER.  The various methods of treatment have been discussed with the patient and family. After consideration of risks, benefits and other options for treatment, the patient has consented to  Procedure(s): RE-EXCISION OF RIGHT BREAST LUMPECTOMY (Right) as a surgical intervention.  The patient's history has been reviewed, patient examined, no change in status, stable for surgery.  I have reviewed the patient's chart and labs.  Questions were answered to the patient's satisfaction.     Stark Klein

## 2022-07-05 NOTE — Anesthesia Procedure Notes (Signed)
Procedure Name: LMA Insertion Date/Time: 07/05/2022 9:01 AM  Performed by: Willa Frater, CRNAPre-anesthesia Checklist: Patient identified, Emergency Drugs available, Suction available and Patient being monitored Patient Re-evaluated:Patient Re-evaluated prior to induction Oxygen Delivery Method: Circle System Utilized Preoxygenation: Pre-oxygenation with 100% oxygen Induction Type: IV induction Ventilation: Mask ventilation without difficulty LMA: LMA inserted LMA Size: 4.0 Number of attempts: 1 Airway Equipment and Method: bite block Placement Confirmation: positive ETCO2 Tube secured with: Tape Dental Injury: Teeth and Oropharynx as per pre-operative assessment

## 2022-07-05 NOTE — Anesthesia Postprocedure Evaluation (Signed)
Anesthesia Post Note  Patient: Terri [name]  Procedure(s) Performed: RE-EXCISION OF RIGHT BREAST LUMPECTOMY (Right: Breast)     Patient location during evaluation: PACU Anesthesia Type: General Level of consciousness: awake and alert Pain management: pain level controlled Vital Signs Assessment: post-procedure vital signs reviewed and stable Respiratory status: spontaneous breathing, nonlabored ventilation, respiratory function stable and patient connected to nasal cannula oxygen Cardiovascular status: blood pressure returned to baseline and stable Postop Assessment: no apparent nausea or vomiting Anesthetic complications: no   No notable events documented.  Last Vitals:  Vitals:   07/05/22 1019 07/05/22 1034  BP:  (!) 142/69  Pulse:  86  Resp:  16  Temp:  36.5 C  SpO2: 97% 96%    Last Pain:  Vitals:   07/05/22 1034  TempSrc: Oral  PainSc: 0-No pain                 Belenda Cruise P Dajai Wahlert

## 2022-07-06 ENCOUNTER — Encounter (HOSPITAL_BASED_OUTPATIENT_CLINIC_OR_DEPARTMENT_OTHER): Payer: Self-pay | Admitting: General Surgery

## 2022-07-06 LAB — SURGICAL PATHOLOGY

## 2022-07-13 ENCOUNTER — Encounter: Payer: Self-pay | Admitting: *Deleted

## 2022-07-27 ENCOUNTER — Ambulatory Visit: Payer: Medicare Other | Admitting: Radiation Oncology

## 2022-07-27 ENCOUNTER — Ambulatory Visit: Payer: Medicare Other

## 2022-08-01 ENCOUNTER — Encounter: Payer: Self-pay | Admitting: *Deleted

## 2022-08-01 NOTE — Progress Notes (Signed)
Radiation Oncology         [phone]) [phone] ________________________________  Name: Terri [name]        MRN: [id number]  Date of Service: 08/03/2022 DOB: [birth year]  CC:Sun, Terri Crown, MD  Truitt Merle, MD     REFERRING PHYSICIAN: Truitt Merle, MD   DIAGNOSIS: The encounter diagnosis was Ductal carcinoma in situ (DCIS) of right breast.   HISTORY OF PRESENT ILLNESS: Terri [name] is a 71 y.o. female originally seen in the multidisciplinary breast clinic for a new diagnosis of right breast cancer. The patient was noted to have screening detected calcifications in the right breast.  Diagnostic mammogram on 05/12/2022 confirmed pleomorphic heterogeneous calcifications in the outer right breast spanning 5.5 cm many of these appeared in linear orientation.  She underwent stereotactic biopsy on 05/25/2022.  Final pathology of both specimens showed high-grade DCIS with comedonecrosis and calcifications in both specimens.  The first specimen showed ER/PR positive findings with strong staining intensity of both and the second specimen showed strong ER positive PR negative testing.    Since her last visit she has undergone a right lumpectomy on 06/21/2022 that showed high-grade DCIS with necrosis and calcifications measuring 4 cm in greatest dimension involving the inferior margin and 2 mm from the medial margin.  She underwent reexcision of her margins on 07/05/2022, the new inferior margin was negative for DCIS.  She is seen today to discuss adjuvant radiotherapy.    PREVIOUS RADIATION THERAPY: No   PAST MEDICAL HISTORY:  Past Medical History:  Diagnosis Date   Allergy    seasonal   Cancer ([place]) 2023   right breast   Eczema    Hypertension    Serrated adenoma of colon        PAST SURGICAL HISTORY: Past Surgical History:  Procedure Laterality Date   BREAST LUMPECTOMY WITH RADIOACTIVE SEED LOCALIZATION Right 06/21/2022   Procedure: RIGHT BREAST LUMPECTOMY WITH RADIOACTIVE SEED LOCALIZATION X2;   Surgeon: Stark Klein, MD;  Location: [place];  Service: General;  Laterality: Right;   COLONOSCOPY  2016   LASIK  2004   RE-EXCISION OF BREAST LUMPECTOMY Right 07/05/2022   Procedure: RE-EXCISION OF RIGHT BREAST LUMPECTOMY;  Surgeon: Stark Klein, MD;  Location: [place];  Service: General;  Laterality: Right;   TUBAL LIGATION       FAMILY HISTORY:  Family History  Problem Relation Age of Onset   Lung cancer Sister        x4   Colon cancer Neg Hx    Esophageal cancer Neg Hx    Rectal cancer Neg Hx    Stomach cancer Neg Hx    Colon polyps Neg Hx      SOCIAL HISTORY:  reports that she has never smoked. She has never used smokeless tobacco. She reports that she does not currently use alcohol. She reports that she does not use drugs.The patient is single and lives in [place]. She is retired from working in [employer] and enjoys spending time with her grandchildren.    ALLERGIES: Patient has no known allergies.   MEDICATIONS:  Current Outpatient Medications  Medication Sig Dispense Refill   amLODipine (NORVASC) 5 MG tablet Take 5 mg by mouth daily.     aspirin (ASPIRIN 81) 81 MG chewable tablet Chew 81 mg by mouth daily.     Biotin 1000 MCG tablet Take 1,000 mcg by mouth daily.     hydrocortisone 2.5 % cream Apply 1 Application topically 2 (two) times daily.  ibuprofen (ADVIL) 200 MG tablet Take 800 mg by mouth every 8 (eight) hours as needed for moderate pain.     losartan (COZAAR) 100 MG tablet Take 100 mg by mouth daily.     Multiple Vitamin (MULTIVITAMIN) tablet Take 1 tablet by mouth daily.     oxyCODONE (OXY IR/ROXICODONE) 5 MG immediate release tablet Take 1 tablet (5 mg total) by mouth every 6 (six) hours as needed for severe pain. 5 tablet 0   No current facility-administered medications for this visit.     REVIEW OF SYSTEMS: On review of systems, the patient reports that she is doing ***     PHYSICAL EXAM:  Wt Readings from Last 3  Encounters:  07/05/22 194 lb 0.1 oz (88 kg)  06/21/22 194 lb 1.6 oz (88 kg)  06/16/22 194 lb 1.6 oz (88 kg)   Temp Readings from Last 3 Encounters:  07/05/22 97.7 F (36.5 C) (Oral)  06/21/22 97.7 F (36.5 C)  06/16/22 98.2 F (36.8 C) (Oral)   BP Readings from Last 3 Encounters:  07/05/22 (!) 142/69  06/21/22 125/60  06/16/22 (!) 156/77   Pulse Readings from Last 3 Encounters:  07/05/22 86  06/21/22 76  06/16/22 74  ***  In general this is a well appearing caucasican female in no acute distress. She's alert and oriented x4 and appropriate throughout the examination. Cardiopulmonary assessment is negative for acute distress and she exhibits normal effort.  Her right breast is well-healed with an intact incision, no evidence of erythema, separation, or drainage is appreciated.    ECOG = ***  0 - Asymptomatic (Fully active, able to carry on all predisease activities without restriction)  1 - Symptomatic but completely ambulatory (Restricted in physically strenuous activity but ambulatory and able to carry out work of a light or sedentary nature. For example, light housework, office work)  2 - Symptomatic, <50% in bed during the day (Ambulatory and capable of all self care but unable to carry out any work activities. Up and about more than 50% of waking hours)  3 - Symptomatic, >50% in bed, but not bedbound (Capable of only limited self-care, confined to bed or chair 50% or more of waking hours)  4 - Bedbound (Completely disabled. Cannot carry on any self-care. Totally confined to bed or chair)  5 - Death   Eustace Pen MM, Creech RH, Tormey DC, et al. [phone]). "Toxicity and response criteria of the [facility]". [place] Oncol. 5 (6): 649-55    LABORATORY DATA:  Lab Results  Component Value Date   WBC 7.6 06/01/2022   HGB 14.5 06/01/2022   HCT 42.7 06/01/2022   MCV 88.8 06/01/2022   PLT 251 06/01/2022   Lab Results  Component Value Date   NA  140 06/01/2022   K 3.7 06/01/2022   CL 107 06/01/2022   CO2 26 06/01/2022   Lab Results  Component Value Date   ALT 21 06/01/2022   AST 22 06/01/2022   ALKPHOS 94 06/01/2022   BILITOT 0.6 06/01/2022      RADIOGRAPHY: No results found.     IMPRESSION/PLAN: 1. High grade, ER/PR positive DCIS of the right breast. Dr. Lisbeth Renshaw reviews the final pathology findings and today we reviewed the nature of right breast disease.  She has done well since surgery.  Given the high-grade component, Dr. Lisbeth Renshaw would recommend adjuvant external radiotherapy to the breast  to reduce risks of local recurrence followed by antiestrogen therapy. We discussed the  risks, benefits, short, and long term effects of radiotherapy, as well as the curative intent, and the patient is interested in proceeding.  I reviewed the delivery and logistics of radiotherapy and Dr. Lisbeth Renshaw recommends 4 weeks of radiotherapy to the right breast. Written consent is obtained and placed in the chart, a copy was provided to the patient.  She will simulate today.   In a visit lasting *** minutes, greater than 50% of the time was spent face to face reviewing her case, as well as in preparation of, discussing, and coordinating the patient's care.      Carola Rhine, [facility]    **Disclaimer: This note was dictated with voice recognition software. Similar sounding words can inadvertently be transcribed and this note may contain transcription errors which may not have been corrected upon publication of note.**

## 2022-08-03 ENCOUNTER — Ambulatory Visit
Admission: RE | Admit: 2022-08-03 | Discharge: 2022-08-03 | Disposition: A | Discharge status: Home / Self Care | Payer: Medicare Other | Source: Ambulatory Visit | Attending: Radiation Oncology | Admitting: Radiation Oncology

## 2022-08-03 ENCOUNTER — Encounter: Payer: Self-pay | Admitting: Radiation Oncology

## 2022-08-03 ENCOUNTER — Other Ambulatory Visit: Payer: Self-pay

## 2022-08-03 VITALS — BP 168/93 | HR 87 | Temp 97.6°F | Resp 18 | Ht 67.0 in | Wt 196.5 lb

## 2022-08-03 DIAGNOSIS — D0511 Intraductal carcinoma in situ of right breast: Secondary | ICD-10-CM | POA: Insufficient documentation

## 2022-08-03 DIAGNOSIS — Z170 Estrogen receptor positive status [ER+]: Secondary | ICD-10-CM | POA: Diagnosis not present

## 2022-08-03 DIAGNOSIS — Z7982 Long term (current) use of aspirin: Secondary | ICD-10-CM | POA: Diagnosis not present

## 2022-08-03 DIAGNOSIS — I10 Essential (primary) hypertension: Secondary | ICD-10-CM | POA: Insufficient documentation

## 2022-08-03 DIAGNOSIS — Z79899 Other long term (current) drug therapy: Secondary | ICD-10-CM | POA: Insufficient documentation

## 2022-08-03 DIAGNOSIS — Z801 Family history of malignant neoplasm of trachea, bronchus and lung: Secondary | ICD-10-CM | POA: Diagnosis not present

## 2022-08-03 DIAGNOSIS — Z86010 Personal history of colonic polyps: Secondary | ICD-10-CM | POA: Insufficient documentation

## 2022-08-03 NOTE — Progress Notes (Signed)
Nursing consult appointment for Ductal carcinoma in situ (DCIS) of right breast. I verified patient's identity and began nursing interview w/ spouse Mr. Tamesha Ellerbrock in attendance.  Patient reports RT breast tenderness at incision line 2/10. No other issues reported at this time.  Meaningful use complete. No chances of pregnancy.  BP (!) 168/93 (BP Location: Left Arm, Patient Position: Sitting, Cuff Size: Normal)   Pulse 87   Temp 97.6 F (36.4 C) (Oral)   Resp 18   Ht '5\' 7"'$  (1.702 m)   Wt 196 lb 8 oz (89.1 kg)   SpO2 98%   BMI 30.78 kg/m   This concludes the nursing interview.  Leandra Kern, LPN

## 2022-08-08 ENCOUNTER — Encounter: Payer: Self-pay | Admitting: *Deleted

## 2022-08-10 DIAGNOSIS — D0511 Intraductal carcinoma in situ of right breast: Secondary | ICD-10-CM | POA: Diagnosis not present

## 2022-08-10 DIAGNOSIS — Z170 Estrogen receptor positive status [ER+]: Secondary | ICD-10-CM | POA: Diagnosis not present

## 2022-08-15 ENCOUNTER — Other Ambulatory Visit: Payer: Self-pay

## 2022-08-15 ENCOUNTER — Ambulatory Visit
Admission: RE | Admit: 2022-08-15 | Discharge: 2022-08-15 | Disposition: A | Discharge status: Home / Self Care | Payer: Medicare Other | Source: Ambulatory Visit | Attending: Radiation Oncology | Admitting: Radiation Oncology

## 2022-08-15 DIAGNOSIS — Z170 Estrogen receptor positive status [ER+]: Secondary | ICD-10-CM | POA: Diagnosis not present

## 2022-08-15 DIAGNOSIS — D0511 Intraductal carcinoma in situ of right breast: Secondary | ICD-10-CM | POA: Diagnosis not present

## 2022-08-15 DIAGNOSIS — Z510 Encounter for antineoplastic radiation therapy: Secondary | ICD-10-CM | POA: Diagnosis not present

## 2022-08-15 LAB — RAD ONC ARIA SESSION SUMMARY
Course Elapsed Days: 0
Plan Fractions Treated to Date: 1
Plan Prescribed Dose Per Fraction: 2.66 Gy
Plan Total Fractions Prescribed: 16
Plan Total Prescribed Dose: 42.56 Gy
Reference Point Dosage Given to Date: 2.66 Gy
Reference Point Session Dosage Given: 2.66 Gy
Session Number: 1

## 2022-08-16 ENCOUNTER — Other Ambulatory Visit: Payer: Self-pay

## 2022-08-16 ENCOUNTER — Ambulatory Visit
Admission: RE | Admit: 2022-08-16 | Discharge: 2022-08-16 | Disposition: A | Discharge status: Home / Self Care | Payer: Medicare Other | Source: Ambulatory Visit | Attending: Radiation Oncology | Admitting: Radiation Oncology

## 2022-08-16 DIAGNOSIS — D0511 Intraductal carcinoma in situ of right breast: Secondary | ICD-10-CM | POA: Diagnosis not present

## 2022-08-16 DIAGNOSIS — Z170 Estrogen receptor positive status [ER+]: Secondary | ICD-10-CM | POA: Diagnosis not present

## 2022-08-16 DIAGNOSIS — Z510 Encounter for antineoplastic radiation therapy: Secondary | ICD-10-CM | POA: Diagnosis not present

## 2022-08-16 LAB — RAD ONC ARIA SESSION SUMMARY
Course Elapsed Days: 1
Plan Fractions Treated to Date: 2
Plan Prescribed Dose Per Fraction: 2.66 Gy
Plan Total Fractions Prescribed: 16
Plan Total Prescribed Dose: 42.56 Gy
Reference Point Dosage Given to Date: 5.32 Gy
Reference Point Session Dosage Given: 2.66 Gy
Session Number: 2

## 2022-08-17 ENCOUNTER — Other Ambulatory Visit: Payer: Self-pay

## 2022-08-17 ENCOUNTER — Ambulatory Visit
Admission: RE | Admit: 2022-08-17 | Discharge: 2022-08-17 | Disposition: A | Discharge status: Home / Self Care | Payer: Medicare Other | Source: Ambulatory Visit | Attending: Radiation Oncology | Admitting: Radiation Oncology

## 2022-08-17 DIAGNOSIS — D0511 Intraductal carcinoma in situ of right breast: Secondary | ICD-10-CM | POA: Diagnosis not present

## 2022-08-17 DIAGNOSIS — Z510 Encounter for antineoplastic radiation therapy: Secondary | ICD-10-CM | POA: Diagnosis not present

## 2022-08-17 DIAGNOSIS — Z170 Estrogen receptor positive status [ER+]: Secondary | ICD-10-CM | POA: Diagnosis not present

## 2022-08-17 LAB — RAD ONC ARIA SESSION SUMMARY
Course Elapsed Days: 2
Plan Fractions Treated to Date: 3
Plan Prescribed Dose Per Fraction: 2.66 Gy
Plan Total Fractions Prescribed: 16
Plan Total Prescribed Dose: 42.56 Gy
Reference Point Dosage Given to Date: 7.98 Gy
Reference Point Session Dosage Given: 2.66 Gy
Session Number: 3

## 2022-08-18 ENCOUNTER — Ambulatory Visit
Admission: RE | Admit: 2022-08-18 | Discharge: 2022-08-18 | Disposition: A | Discharge status: Home / Self Care | Payer: Medicare Other | Source: Ambulatory Visit | Attending: Radiation Oncology | Admitting: Radiation Oncology

## 2022-08-18 ENCOUNTER — Other Ambulatory Visit: Payer: Self-pay

## 2022-08-18 DIAGNOSIS — Z170 Estrogen receptor positive status [ER+]: Secondary | ICD-10-CM | POA: Diagnosis not present

## 2022-08-18 DIAGNOSIS — Z510 Encounter for antineoplastic radiation therapy: Secondary | ICD-10-CM | POA: Diagnosis not present

## 2022-08-18 DIAGNOSIS — D0511 Intraductal carcinoma in situ of right breast: Secondary | ICD-10-CM | POA: Diagnosis not present

## 2022-08-18 LAB — RAD ONC ARIA SESSION SUMMARY
Course Elapsed Days: 3
Plan Fractions Treated to Date: 4
Plan Prescribed Dose Per Fraction: 2.66 Gy
Plan Total Fractions Prescribed: 16
Plan Total Prescribed Dose: 42.56 Gy
Reference Point Dosage Given to Date: 10.64 Gy
Reference Point Session Dosage Given: 2.66 Gy
Session Number: 4

## 2022-08-19 ENCOUNTER — Ambulatory Visit
Admission: RE | Admit: 2022-08-19 | Discharge: 2022-08-19 | Disposition: A | Discharge status: Home / Self Care | Payer: Medicare Other | Source: Ambulatory Visit | Attending: Radiation Oncology | Admitting: Radiation Oncology

## 2022-08-19 ENCOUNTER — Other Ambulatory Visit: Payer: Self-pay

## 2022-08-19 DIAGNOSIS — Z170 Estrogen receptor positive status [ER+]: Secondary | ICD-10-CM | POA: Diagnosis not present

## 2022-08-19 DIAGNOSIS — D0511 Intraductal carcinoma in situ of right breast: Secondary | ICD-10-CM | POA: Insufficient documentation

## 2022-08-19 DIAGNOSIS — Z510 Encounter for antineoplastic radiation therapy: Secondary | ICD-10-CM | POA: Diagnosis not present

## 2022-08-19 LAB — RAD ONC ARIA SESSION SUMMARY
Course Elapsed Days: 4
Plan Fractions Treated to Date: 5
Plan Prescribed Dose Per Fraction: 2.66 Gy
Plan Total Fractions Prescribed: 16
Plan Total Prescribed Dose: 42.56 Gy
Reference Point Dosage Given to Date: 13.3 Gy
Reference Point Session Dosage Given: 2.66 Gy
Session Number: 5

## 2022-08-19 MED ORDER — ALRA NON-METALLIC DEODORANT (RAD-ONC)
1.0000 | Freq: Once | TOPICAL | Status: AC
  Administered 2022-08-19: 1 via TOPICAL

## 2022-08-19 MED ORDER — RADIAPLEXRX EX GEL: Freq: Once | CUTANEOUS | Status: AC

## 2022-08-22 ENCOUNTER — Other Ambulatory Visit: Payer: Self-pay

## 2022-08-22 ENCOUNTER — Ambulatory Visit
Admission: RE | Admit: 2022-08-22 | Discharge: 2022-08-22 | Disposition: A | Discharge status: Home / Self Care | Payer: Medicare Other | Source: Ambulatory Visit | Attending: Radiation Oncology | Admitting: Radiation Oncology

## 2022-08-22 DIAGNOSIS — D0511 Intraductal carcinoma in situ of right breast: Secondary | ICD-10-CM | POA: Diagnosis not present

## 2022-08-22 DIAGNOSIS — Z510 Encounter for antineoplastic radiation therapy: Secondary | ICD-10-CM | POA: Diagnosis not present

## 2022-08-22 DIAGNOSIS — Z170 Estrogen receptor positive status [ER+]: Secondary | ICD-10-CM | POA: Diagnosis not present

## 2022-08-22 LAB — RAD ONC ARIA SESSION SUMMARY
Course Elapsed Days: 7
Plan Fractions Treated to Date: 6
Plan Prescribed Dose Per Fraction: 2.66 Gy
Plan Total Fractions Prescribed: 16
Plan Total Prescribed Dose: 42.56 Gy
Reference Point Dosage Given to Date: 15.96 Gy
Reference Point Session Dosage Given: 2.66 Gy
Session Number: 6

## 2022-08-23 ENCOUNTER — Ambulatory Visit
Admission: RE | Admit: 2022-08-23 | Discharge: 2022-08-23 | Disposition: A | Discharge status: Home / Self Care | Payer: Medicare Other | Source: Ambulatory Visit | Attending: Radiation Oncology | Admitting: Radiation Oncology

## 2022-08-23 ENCOUNTER — Other Ambulatory Visit: Payer: Self-pay

## 2022-08-23 DIAGNOSIS — D0511 Intraductal carcinoma in situ of right breast: Secondary | ICD-10-CM | POA: Diagnosis not present

## 2022-08-23 DIAGNOSIS — Z510 Encounter for antineoplastic radiation therapy: Secondary | ICD-10-CM | POA: Diagnosis not present

## 2022-08-23 DIAGNOSIS — Z170 Estrogen receptor positive status [ER+]: Secondary | ICD-10-CM | POA: Diagnosis not present

## 2022-08-23 LAB — RAD ONC ARIA SESSION SUMMARY
Course Elapsed Days: 8
Plan Fractions Treated to Date: 7
Plan Prescribed Dose Per Fraction: 2.66 Gy
Plan Total Fractions Prescribed: 16
Plan Total Prescribed Dose: 42.56 Gy
Reference Point Dosage Given to Date: 18.62 Gy
Reference Point Session Dosage Given: 2.66 Gy
Session Number: 7

## 2022-08-24 ENCOUNTER — Other Ambulatory Visit: Payer: Self-pay

## 2022-08-24 ENCOUNTER — Ambulatory Visit
Admission: RE | Admit: 2022-08-24 | Discharge: 2022-08-24 | Disposition: A | Discharge status: Home / Self Care | Payer: Medicare Other | Source: Ambulatory Visit | Attending: Radiation Oncology | Admitting: Radiation Oncology

## 2022-08-24 DIAGNOSIS — Z510 Encounter for antineoplastic radiation therapy: Secondary | ICD-10-CM | POA: Diagnosis not present

## 2022-08-24 DIAGNOSIS — Z170 Estrogen receptor positive status [ER+]: Secondary | ICD-10-CM | POA: Diagnosis not present

## 2022-08-24 DIAGNOSIS — D0511 Intraductal carcinoma in situ of right breast: Secondary | ICD-10-CM | POA: Diagnosis not present

## 2022-08-24 LAB — RAD ONC ARIA SESSION SUMMARY
Course Elapsed Days: 9
Plan Fractions Treated to Date: 8
Plan Prescribed Dose Per Fraction: 2.66 Gy
Plan Total Fractions Prescribed: 16
Plan Total Prescribed Dose: 42.56 Gy
Reference Point Dosage Given to Date: 21.28 Gy
Reference Point Session Dosage Given: 2.66 Gy
Session Number: 8

## 2022-08-25 ENCOUNTER — Ambulatory Visit
Admission: RE | Admit: 2022-08-25 | Discharge: 2022-08-25 | Disposition: A | Discharge status: Home / Self Care | Payer: Medicare Other | Source: Ambulatory Visit | Attending: Radiation Oncology | Admitting: Radiation Oncology

## 2022-08-25 ENCOUNTER — Other Ambulatory Visit: Payer: Self-pay

## 2022-08-25 DIAGNOSIS — D0511 Intraductal carcinoma in situ of right breast: Secondary | ICD-10-CM | POA: Diagnosis not present

## 2022-08-25 DIAGNOSIS — Z510 Encounter for antineoplastic radiation therapy: Secondary | ICD-10-CM | POA: Diagnosis not present

## 2022-08-25 DIAGNOSIS — Z170 Estrogen receptor positive status [ER+]: Secondary | ICD-10-CM | POA: Diagnosis not present

## 2022-08-25 LAB — RAD ONC ARIA SESSION SUMMARY
Course Elapsed Days: 10
Plan Fractions Treated to Date: 9
Plan Prescribed Dose Per Fraction: 2.66 Gy
Plan Total Fractions Prescribed: 16
Plan Total Prescribed Dose: 42.56 Gy
Reference Point Dosage Given to Date: 23.94 Gy
Reference Point Session Dosage Given: 2.66 Gy
Session Number: 9

## 2022-08-26 ENCOUNTER — Other Ambulatory Visit: Payer: Self-pay

## 2022-08-26 ENCOUNTER — Ambulatory Visit
Admission: RE | Admit: 2022-08-26 | Discharge: 2022-08-26 | Disposition: A | Discharge status: Home / Self Care | Payer: Medicare Other | Source: Ambulatory Visit | Attending: Radiation Oncology | Admitting: Radiation Oncology

## 2022-08-26 DIAGNOSIS — Z170 Estrogen receptor positive status [ER+]: Secondary | ICD-10-CM | POA: Diagnosis not present

## 2022-08-26 DIAGNOSIS — D0511 Intraductal carcinoma in situ of right breast: Secondary | ICD-10-CM | POA: Diagnosis not present

## 2022-08-26 DIAGNOSIS — Z510 Encounter for antineoplastic radiation therapy: Secondary | ICD-10-CM | POA: Diagnosis not present

## 2022-08-26 LAB — RAD ONC ARIA SESSION SUMMARY
Course Elapsed Days: 11
Plan Fractions Treated to Date: 10
Plan Prescribed Dose Per Fraction: 2.66 Gy
Plan Total Fractions Prescribed: 16
Plan Total Prescribed Dose: 42.56 Gy
Reference Point Dosage Given to Date: 26.6 Gy
Reference Point Session Dosage Given: 2.66 Gy
Session Number: 10

## 2022-08-29 ENCOUNTER — Ambulatory Visit
Admission: RE | Admit: 2022-08-29 | Discharge: 2022-08-29 | Disposition: A | Discharge status: Home / Self Care | Payer: Medicare Other | Source: Ambulatory Visit | Attending: Radiation Oncology | Admitting: Radiation Oncology

## 2022-08-29 ENCOUNTER — Other Ambulatory Visit: Payer: Self-pay

## 2022-08-29 DIAGNOSIS — Z170 Estrogen receptor positive status [ER+]: Secondary | ICD-10-CM | POA: Diagnosis not present

## 2022-08-29 DIAGNOSIS — D0511 Intraductal carcinoma in situ of right breast: Secondary | ICD-10-CM | POA: Diagnosis not present

## 2022-08-29 DIAGNOSIS — Z510 Encounter for antineoplastic radiation therapy: Secondary | ICD-10-CM | POA: Diagnosis not present

## 2022-08-29 LAB — RAD ONC ARIA SESSION SUMMARY
Course Elapsed Days: 14
Plan Fractions Treated to Date: 11
Plan Prescribed Dose Per Fraction: 2.66 Gy
Plan Total Fractions Prescribed: 16
Plan Total Prescribed Dose: 42.56 Gy
Reference Point Dosage Given to Date: 29.26 Gy
Reference Point Session Dosage Given: 2.66 Gy
Session Number: 11

## 2022-08-30 ENCOUNTER — Other Ambulatory Visit: Payer: Self-pay

## 2022-08-30 ENCOUNTER — Ambulatory Visit
Admission: RE | Admit: 2022-08-30 | Discharge: 2022-08-30 | Disposition: A | Discharge status: Home / Self Care | Payer: Medicare Other | Source: Ambulatory Visit | Attending: Radiation Oncology | Admitting: Radiation Oncology

## 2022-08-30 DIAGNOSIS — Z510 Encounter for antineoplastic radiation therapy: Secondary | ICD-10-CM | POA: Diagnosis not present

## 2022-08-30 DIAGNOSIS — D0511 Intraductal carcinoma in situ of right breast: Secondary | ICD-10-CM | POA: Diagnosis not present

## 2022-08-30 DIAGNOSIS — Z170 Estrogen receptor positive status [ER+]: Secondary | ICD-10-CM | POA: Diagnosis not present

## 2022-08-30 LAB — RAD ONC ARIA SESSION SUMMARY
Course Elapsed Days: 15
Plan Fractions Treated to Date: 12
Plan Prescribed Dose Per Fraction: 2.66 Gy
Plan Total Fractions Prescribed: 16
Plan Total Prescribed Dose: 42.56 Gy
Reference Point Dosage Given to Date: 31.92 Gy
Reference Point Session Dosage Given: 2.66 Gy
Session Number: 12

## 2022-08-31 ENCOUNTER — Other Ambulatory Visit: Payer: Self-pay

## 2022-08-31 ENCOUNTER — Ambulatory Visit
Admission: RE | Admit: 2022-08-31 | Discharge: 2022-08-31 | Disposition: A | Discharge status: Home / Self Care | Payer: Medicare Other | Source: Ambulatory Visit | Attending: Radiation Oncology | Admitting: Radiation Oncology

## 2022-08-31 DIAGNOSIS — D0511 Intraductal carcinoma in situ of right breast: Secondary | ICD-10-CM | POA: Diagnosis not present

## 2022-08-31 DIAGNOSIS — Z510 Encounter for antineoplastic radiation therapy: Secondary | ICD-10-CM | POA: Diagnosis not present

## 2022-08-31 DIAGNOSIS — Z170 Estrogen receptor positive status [ER+]: Secondary | ICD-10-CM | POA: Diagnosis not present

## 2022-08-31 LAB — RAD ONC ARIA SESSION SUMMARY
Course Elapsed Days: 16
Plan Fractions Treated to Date: 13
Plan Prescribed Dose Per Fraction: 2.66 Gy
Plan Total Fractions Prescribed: 16
Plan Total Prescribed Dose: 42.56 Gy
Reference Point Dosage Given to Date: 34.58 Gy
Reference Point Session Dosage Given: 2.66 Gy
Session Number: 13

## 2022-09-01 ENCOUNTER — Other Ambulatory Visit: Payer: Self-pay

## 2022-09-01 ENCOUNTER — Ambulatory Visit
Admission: RE | Admit: 2022-09-01 | Discharge: 2022-09-01 | Disposition: A | Discharge status: Home / Self Care | Payer: Medicare Other | Source: Ambulatory Visit | Attending: Radiation Oncology | Admitting: Radiation Oncology

## 2022-09-01 DIAGNOSIS — D0511 Intraductal carcinoma in situ of right breast: Secondary | ICD-10-CM | POA: Diagnosis not present

## 2022-09-01 DIAGNOSIS — Z510 Encounter for antineoplastic radiation therapy: Secondary | ICD-10-CM | POA: Diagnosis not present

## 2022-09-01 DIAGNOSIS — Z170 Estrogen receptor positive status [ER+]: Secondary | ICD-10-CM | POA: Diagnosis not present

## 2022-09-01 LAB — RAD ONC ARIA SESSION SUMMARY
Course Elapsed Days: 17
Plan Fractions Treated to Date: 14
Plan Prescribed Dose Per Fraction: 2.66 Gy
Plan Total Fractions Prescribed: 16
Plan Total Prescribed Dose: 42.56 Gy
Reference Point Dosage Given to Date: 37.24 Gy
Reference Point Session Dosage Given: 2.66 Gy
Session Number: 14

## 2022-09-02 ENCOUNTER — Ambulatory Visit
Admission: RE | Admit: 2022-09-02 | Discharge: 2022-09-02 | Disposition: A | Discharge status: Home / Self Care | Payer: Medicare Other | Source: Ambulatory Visit | Attending: Radiation Oncology | Admitting: Radiation Oncology

## 2022-09-02 ENCOUNTER — Other Ambulatory Visit: Payer: Self-pay

## 2022-09-02 DIAGNOSIS — D0511 Intraductal carcinoma in situ of right breast: Secondary | ICD-10-CM | POA: Diagnosis not present

## 2022-09-02 DIAGNOSIS — Z170 Estrogen receptor positive status [ER+]: Secondary | ICD-10-CM | POA: Diagnosis not present

## 2022-09-02 DIAGNOSIS — Z510 Encounter for antineoplastic radiation therapy: Secondary | ICD-10-CM | POA: Diagnosis not present

## 2022-09-02 LAB — RAD ONC ARIA SESSION SUMMARY
Course Elapsed Days: 18
Plan Fractions Treated to Date: 15
Plan Prescribed Dose Per Fraction: 2.66 Gy
Plan Total Fractions Prescribed: 16
Plan Total Prescribed Dose: 42.56 Gy
Reference Point Dosage Given to Date: 39.9 Gy
Reference Point Session Dosage Given: 2.66 Gy
Session Number: 15

## 2022-09-05 ENCOUNTER — Other Ambulatory Visit: Payer: Self-pay

## 2022-09-05 ENCOUNTER — Ambulatory Visit
Admission: RE | Admit: 2022-09-05 | Discharge: 2022-09-05 | Disposition: A | Discharge status: Home / Self Care | Payer: Medicare Other | Source: Ambulatory Visit | Attending: Radiation Oncology | Admitting: Radiation Oncology

## 2022-09-05 ENCOUNTER — Encounter: Payer: Self-pay | Admitting: *Deleted

## 2022-09-05 DIAGNOSIS — Z170 Estrogen receptor positive status [ER+]: Secondary | ICD-10-CM | POA: Diagnosis not present

## 2022-09-05 DIAGNOSIS — Z510 Encounter for antineoplastic radiation therapy: Secondary | ICD-10-CM | POA: Diagnosis not present

## 2022-09-05 DIAGNOSIS — D0511 Intraductal carcinoma in situ of right breast: Secondary | ICD-10-CM | POA: Diagnosis not present

## 2022-09-05 LAB — RAD ONC ARIA SESSION SUMMARY
Course Elapsed Days: 21
Plan Fractions Treated to Date: 16
Plan Prescribed Dose Per Fraction: 2.66 Gy
Plan Total Fractions Prescribed: 16
Plan Total Prescribed Dose: 42.56 Gy
Reference Point Dosage Given to Date: 42.56 Gy
Reference Point Session Dosage Given: 2.66 Gy
Session Number: 16

## 2022-09-05 NOTE — Progress Notes (Signed)
                                                                                                                                                              Patient Name: Terri [name] MRN: [id number] DOB: 03/12/1951 Referring Physician: Donald Prose (Profile Not Attached) Date of Service: 09/09/2022 [facility], [place]                                                        End Of Treatment Note  Diagnoses: D05.11-Intraductal carcinoma in situ of right breast  Cancer Staging:  High grade, ER/PR positive DCIS of the right breast.   Intent: Curative  Radiation Treatment Dates: 08/15/2022 through 09/09/2022 Site Technique Total Dose (Gy) Dose per Fx (Gy) Completed Fx Beam Energies  Breast, Right: Breast_R 3D 42.56/42.56 2.66 16/16 10XFFF  Breast, Right: Breast_R_Bst 3D 8/8 2 4/4 6X, 10X   Narrative: The patient tolerated radiation therapy relatively well. She developed fatigue and anticipated skin changes in the treatment field.   Plan: The patient will receive a call in about one month from the radiation oncology department. She will continue follow up with Dr. Burr Medico as well.   ________________________________________________    Carola Rhine, [facility]

## 2022-09-06 ENCOUNTER — Other Ambulatory Visit: Payer: Self-pay

## 2022-09-06 ENCOUNTER — Ambulatory Visit
Admission: RE | Admit: 2022-09-06 | Discharge: 2022-09-06 | Disposition: A | Discharge status: Home / Self Care | Payer: Medicare Other | Source: Ambulatory Visit | Attending: Radiation Oncology | Admitting: Radiation Oncology

## 2022-09-06 DIAGNOSIS — Z170 Estrogen receptor positive status [ER+]: Secondary | ICD-10-CM | POA: Diagnosis not present

## 2022-09-06 DIAGNOSIS — D0511 Intraductal carcinoma in situ of right breast: Secondary | ICD-10-CM | POA: Diagnosis not present

## 2022-09-06 DIAGNOSIS — Z510 Encounter for antineoplastic radiation therapy: Secondary | ICD-10-CM | POA: Diagnosis not present

## 2022-09-06 LAB — RAD ONC ARIA SESSION SUMMARY
Course Elapsed Days: 22
Plan Fractions Treated to Date: 1
Plan Prescribed Dose Per Fraction: 2 Gy
Plan Total Fractions Prescribed: 4
Plan Total Prescribed Dose: 8 Gy
Reference Point Dosage Given to Date: 2 Gy
Reference Point Session Dosage Given: 2 Gy
Session Number: 17

## 2022-09-07 ENCOUNTER — Ambulatory Visit
Admission: RE | Admit: 2022-09-07 | Discharge: 2022-09-07 | Disposition: A | Discharge status: Home / Self Care | Payer: Medicare Other | Source: Ambulatory Visit | Attending: Radiation Oncology | Admitting: Radiation Oncology

## 2022-09-07 ENCOUNTER — Other Ambulatory Visit: Payer: Self-pay

## 2022-09-07 DIAGNOSIS — D0511 Intraductal carcinoma in situ of right breast: Secondary | ICD-10-CM | POA: Diagnosis not present

## 2022-09-07 LAB — RAD ONC ARIA SESSION SUMMARY
Course Elapsed Days: 23
Plan Fractions Treated to Date: 2
Plan Prescribed Dose Per Fraction: 2 Gy
Plan Total Fractions Prescribed: 4
Plan Total Prescribed Dose: 8 Gy
Reference Point Dosage Given to Date: 4 Gy
Reference Point Session Dosage Given: 2 Gy
Session Number: 18

## 2022-09-08 ENCOUNTER — Ambulatory Visit
Admission: RE | Admit: 2022-09-08 | Discharge: 2022-09-08 | Disposition: A | Discharge status: Home / Self Care | Payer: Medicare Other | Source: Ambulatory Visit | Attending: Radiation Oncology | Admitting: Radiation Oncology

## 2022-09-08 ENCOUNTER — Other Ambulatory Visit: Payer: Self-pay

## 2022-09-08 DIAGNOSIS — D0511 Intraductal carcinoma in situ of right breast: Secondary | ICD-10-CM | POA: Diagnosis not present

## 2022-09-08 LAB — RAD ONC ARIA SESSION SUMMARY
Course Elapsed Days: 24
Plan Fractions Treated to Date: 3
Plan Prescribed Dose Per Fraction: 2 Gy
Plan Total Fractions Prescribed: 4
Plan Total Prescribed Dose: 8 Gy
Reference Point Dosage Given to Date: 6 Gy
Reference Point Session Dosage Given: 2 Gy
Session Number: 19

## 2022-09-08 NOTE — Progress Notes (Unsigned)
[place]   [phone] Fax:(336) [phone]   Clinic Follow up Note   Patient Care Team: Donald Prose, MD as PCP - General (Family Medicine) Mauro Kaufmann, RN as Oncology Nurse Navigator Rockwell Germany, RN as Oncology Nurse Navigator Stark Klein, MD as Consulting Physician (General Surgery) Truitt Merle, MD as Consulting Physician (Hematology) Kyung Rudd, MD as Consulting Physician (Radiation Oncology)  Date of Service:  09/09/2022  CHIEF COMPLAINT: f/u of Right Breast Cancer, ER+   CURRENT THERAPY:  Pending  ASSESSMENT:  Terri [name] is a 71 y.o. female with   No problem-specific Assessment & Plan notes found for this encounter.     PLAN: - Discuss Antiestrogen therapy Tamoxifen and it side effects  - Pt consent to Tamoxifen -referral Genetic Test  -f/u in 3 mths Survivorship     SUMMARY OF ONCOLOGIC HISTORY: Oncology History Overview Note   Cancer Staging  Ductal carcinoma in situ (DCIS) of right breast Staging form: Breast, AJCC 8th Edition - Clinical stage from 05/25/2022: Stage 0 (cTis (DCIS), cN0, cM0, G2, ER+, PR+, HER2: Not Assessed) - Signed by Truitt Merle, MD on 05/31/2022    Ductal carcinoma in situ (DCIS) of right breast  05/12/2022 Mammogram   CLINICAL DATA:  71 year old female for further evaluation of RIGHT breast calcifications identified on screening mammogram.   EXAM: DIGITAL DIAGNOSTIC UNILATERAL RIGHT MAMMOGRAM  IMPRESSION: Suspicious OUTER RIGHT breast calcifications spanning a distance of 5.5 cm. Tissue sampling of the anterior and posterior aspects of these calcifications recommended.   05/25/2022 Cancer Staging   Staging form: Breast, AJCC 8th Edition - Clinical stage from 05/25/2022: Stage 0 (cTis (DCIS), cN0, cM0, G2, ER+, PR+, HER2: Not Assessed) - Signed by Truitt Merle, MD on 05/31/2022 Stage prefix: Initial diagnosis Histologic grading system: 3 grade system   05/25/2022 Initial Biopsy   Diagnosis 1.  Breast, right, needle core biopsy - DUCTAL CARCINOMA IN SITU, SOLID TYPE WITH COMEDONECROSIS AND ASSOCIATED CALCIFICATIONS, NUCLEAR GRADE 3 OF 3 - NECROSIS: PRESENT - CALCIFICATIONS: PRESENT - DCIS LENGTH: 9 MM IN GREATEST LINEAR DIMENSION ON HEAVILY FRAGMENTED CORES  PROGNOSTIC INDICATORS Results: Estrogen Receptor: 90%, POSITIVE, STRONG STAINING INTENSITY Progesterone Receptor: 10%, POSITIVE, STRONG STAINING INTENSITY   2. Breast, right, needle core biopsy - DUCTAL CARCINOMA IN SITU, SOLID TYPE WITH COMEDONECROSIS AND ASSOCIATED CALCIFICATIONS, NUCLEAR GRADE 3 OF 3 - NECROSIS: PRESENT - CALCIFICATIONS: PRESENT - DCIS LENGTH: 7 MM IN GREATEST LINEAR DIMENSION ON HEAVILY FRAGMENTED CORES  PROGNOSTIC INDICATORS Results: IMMUNOHISTOCHEMICAL AND MORPHOMETRIC ANALYSIS PERFORMED MANUALLY Estrogen Receptor: 20%, POSITIVE, STRONG STAINING INTENSITY Progesterone Receptor: 0%, NEGATIVE   05/30/2022 Initial Diagnosis   Ductal carcinoma in situ (DCIS) of right breast   06/21/2022 Cancer Staging   Staging form: Breast, AJCC 8th Edition - Pathologic stage from 06/21/2022: Stage Unknown (pTis (DCIS), pNX, cM0, G3, ER+, PR-, HER2: Not Assessed) - Signed by Truitt Merle, MD on 09/09/2022 Histologic grading system: 3 grade system Residual tumor (R): R0 - None      INTERVAL HISTORY:  Terri [name] is here for a follow up of Right Breast Cancer, ER+   She was last seen by me on 06/01/2022 She presents to the clinic alone. Pt reports today was her last day of Radiation, and she tolerated it very well. Pt states she had to have a second surgery and she did very well.     All other systems were reviewed with the patient and are negative.  MEDICAL HISTORY:  Past Medical History:  Diagnosis  Date   Allergy    seasonal   Cancer [facility]) 2023   right breast   Eczema    Hypertension    Serrated adenoma of colon     SURGICAL HISTORY: Past Surgical History:  Procedure Laterality Date   BREAST  LUMPECTOMY WITH RADIOACTIVE SEED LOCALIZATION Right 06/21/2022   Procedure: RIGHT BREAST LUMPECTOMY WITH RADIOACTIVE SEED LOCALIZATION X2;  Surgeon: Stark Klein, MD;  Location: [place];  Service: General;  Laterality: Right;   COLONOSCOPY  2016   LASIK  2004   RE-EXCISION OF BREAST LUMPECTOMY Right 07/05/2022   Procedure: RE-EXCISION OF RIGHT BREAST LUMPECTOMY;  Surgeon: Stark Klein, MD;  Location: [place];  Service: General;  Laterality: Right;   TUBAL LIGATION      I have reviewed the social history and family history with the patient and they are unchanged from previous note.  ALLERGIES:  has No Known Allergies.  MEDICATIONS:  Current Outpatient Medications  Medication Sig Dispense Refill   amLODipine (NORVASC) 5 MG tablet Take 5 mg by mouth daily.     aspirin (ASPIRIN 81) 81 MG chewable tablet Chew 81 mg by mouth daily.     hydrocortisone 2.5 % cream Apply 1 Application topically 2 (two) times daily.     losartan (COZAAR) 100 MG tablet Take 100 mg by mouth daily.     Multiple Vitamin (MULTIVITAMIN) tablet Take 1 tablet by mouth daily.     oxyCODONE (OXY IR/ROXICODONE) 5 MG immediate release tablet Take 1 tablet (5 mg total) by mouth every 6 (six) hours as needed for severe pain. 5 tablet 0   No current facility-administered medications for this visit.    PHYSICAL EXAMINATION: ECOG PERFORMANCE STATUS: {CHL ONC ECOG GH:[id number]}  Vitals:   09/09/22 0952  BP: (!) 148/80  Pulse: 89  Resp: 15  Temp: 98.3 F (36.8 C)  SpO2: 98%   Wt Readings from Last 3 Encounters:  09/09/22 191 lb 1.6 oz (86.7 kg)  08/03/22 196 lb 8 oz (89.1 kg)  07/05/22 194 lb 0.1 oz (88 kg)    GENERAL:alert, no distress and comfortable SKIN: skin color, texture, turgor are normal, no rashes or significant lesions EYES: normal, Conjunctiva are pink and non-injected, sclera clear  NECK: (-)supple, thyroid normal size, non-tender, without nodularity LYMPH: (-)  no palpable  lymphadenopathy in the cervical, axillary  LUNGS: clear to auscultation and percussion with normal breathing effort HEART: regular rate & rhythm and no murmurs and no lower extremity edema Musculoskeletal:no cyanosis of digits and no clubbing  NEURO: alert & oriented x 3 with fluent speech, no focal motor/sensory deficits BREAST: radiation cause some redness. Incision around the nipple scar tissue a little hard. No palpable mass, Breast exam Benign.    LABORATORY DATA:  I have reviewed the data as listed    Latest Ref Rng & Units 06/01/2022   12:23 PM  CBC  WBC 4.0 - 10.5 K/uL 7.6   Hemoglobin 12.0 - 15.0 g/dL 14.5   Hematocrit 36.0 - 46.0 % 42.7   Platelets 150 - 400 K/uL 251         Latest Ref Rng & Units 06/01/2022   12:23 PM  CMP  Glucose 70 - 99 mg/dL 105   BUN 8 - 23 mg/dL 15   Creatinine 0.44 - 1.00 mg/dL 0.71   Sodium 135 - 145 mmol/L 140   Potassium 3.5 - 5.1 mmol/L 3.7   Chloride 98 - 111 mmol/L 107   CO2 22 -  32 mmol/L 26   Calcium 8.9 - 10.3 mg/dL 9.9   Total Protein 6.5 - 8.1 g/dL 7.3   Total Bilirubin 0.3 - 1.2 mg/dL 0.6   Alkaline Phos 38 - 126 U/L 94   AST 15 - 41 U/L 22   ALT 0 - 44 U/L 21       RADIOGRAPHIC STUDIES: I have personally reviewed the radiological images as listed and agreed with the findings in the report. No results found.    No orders of the defined types were placed in this encounter.  All questions were answered. The patient knows to call the clinic with any problems, questions or concerns. No barriers to learning was detected. The total time spent in the appointment was {CHL ONC TIME VISIT - TXMIW:[id number]}.     Baldemar Friday, CMA 09/09/2022   I, Audry Riles, CMA, am acting as scribe for Truitt Merle, MD.   {Add scribe attestation statement}

## 2022-09-09 ENCOUNTER — Ambulatory Visit
Admission: RE | Admit: 2022-09-09 | Discharge: 2022-09-09 | Disposition: A | Discharge status: Home / Self Care | Payer: Medicare Other | Source: Ambulatory Visit | Attending: Radiation Oncology | Admitting: Radiation Oncology

## 2022-09-09 ENCOUNTER — Encounter: Payer: Self-pay | Admitting: Radiation Oncology

## 2022-09-09 ENCOUNTER — Inpatient Hospital Stay: Payer: Medicare Other | Attending: Hematology | Admitting: Hematology

## 2022-09-09 ENCOUNTER — Other Ambulatory Visit: Payer: Self-pay

## 2022-09-09 ENCOUNTER — Other Ambulatory Visit: Payer: Self-pay | Admitting: Hematology

## 2022-09-09 VITALS — BP 148/80 | HR 89 | Temp 98.3°F | Resp 15 | Wt 191.1 lb

## 2022-09-09 DIAGNOSIS — Z510 Encounter for antineoplastic radiation therapy: Secondary | ICD-10-CM | POA: Diagnosis not present

## 2022-09-09 DIAGNOSIS — Z79899 Other long term (current) drug therapy: Secondary | ICD-10-CM | POA: Diagnosis not present

## 2022-09-09 DIAGNOSIS — Z170 Estrogen receptor positive status [ER+]: Secondary | ICD-10-CM | POA: Diagnosis not present

## 2022-09-09 DIAGNOSIS — D0511 Intraductal carcinoma in situ of right breast: Secondary | ICD-10-CM | POA: Diagnosis not present

## 2022-09-09 LAB — RAD ONC ARIA SESSION SUMMARY
Course Elapsed Days: 25
Plan Fractions Treated to Date: 4
Plan Prescribed Dose Per Fraction: 2 Gy
Plan Total Fractions Prescribed: 4
Plan Total Prescribed Dose: 8 Gy
Reference Point Dosage Given to Date: 8 Gy
Reference Point Session Dosage Given: 2 Gy
Session Number: 20

## 2022-09-09 MED ORDER — TAMOXIFEN CITRATE 10 MG PO TABS: 5.0000 mg | ORAL_TABLET | Freq: Every day | ORAL | 1 refills | Status: DC

## 2022-09-09 MED ORDER — TAMOXIFEN CITRATE 10 MG PO TABS: 10.0000 mg | ORAL_TABLET | Freq: Every day | ORAL | 1 refills | Status: DC

## 2022-09-10 ENCOUNTER — Encounter: Payer: Self-pay | Admitting: Hematology

## 2022-09-10 NOTE — Assessment & Plan Note (Addendum)
-  Diagnosed in 04/2022, found on screening MM, ER+/PR+ -s/p lumpectomy 06/21/22, showed G3 DCIS, she required secondary surgery to clear margins  -on adjuvant radiation now -we discussed the benefit and side effect of adjuvant low dose tamoxifen for 3 years  ---The potential side effects, which includes but not limited to, hot flash, skin and vaginal dryness, slightly increased risk of cardiovascular disease and cataract, small risk of thrombosis (minimum with low dose) and endometrial cancer, were discussed with her in great details. Preventive strategies for thrombosis, such as being physically active, using compression stocks, avoid cigarette smoking, etc., were reviewed with her. I also recommend her to follow-up with her gynecologist once a year, and watch for vaginal spotting or bleeding, as a clinically sign of endometrial cancer, etc. She voiced good understanding, and agrees to proceed. Will start after she completes adjuvant breast radiation. -We also reviewed the breast cancer surveillance, including annual mammogram, and physical exam. Due to her dense breast tissue, I also recommend annual screening breast MRI. -After detailed discussion, she voiced good understanding, and agrees to try low-dose tamoxifen.

## 2022-10-24 ENCOUNTER — Ambulatory Visit
Admission: RE | Admit: 2022-10-24 | Discharge: 2022-10-24 | Disposition: A | Discharge status: Home / Self Care | Payer: Medicare Other | Source: Ambulatory Visit | Attending: Radiation Oncology | Admitting: Radiation Oncology

## 2022-10-24 DIAGNOSIS — D0511 Intraductal carcinoma in situ of right breast: Secondary | ICD-10-CM | POA: Insufficient documentation

## 2022-10-24 NOTE — Progress Notes (Signed)
  Radiation Oncology         ([phone] ________________________________  Name: See Beharry MRN: [id number]  Date of Service: 10/24/2022  DOB: [birth year]  Post Treatment Telephone Note  Diagnosis:  High grade, ER/PR positive DCIS of the right breast.   Intent: Curative  Radiation Treatment Dates: 08/15/2022 through 09/09/2022 Site Technique Total Dose (Gy) Dose per Fx (Gy) Completed Fx Beam Energies  Breast, Right: Breast_R 3D 42.56/42.56 2.66 16/16 10XFFF  Breast, Right: Breast_R_Bst 3D 8/8 2 4/4 6X, 10X   (as documented in provider EOT note)   The patient was available for call today.   Symptoms of fatigue have improved since completing therapy.  Symptoms of skin changes have improved since completing therapy.  The patient was encouraged to avoid sun exposure in the area of prior treatment for up to one year following radiation with either sunscreen or by the style of clothing worn in the sun.  The patient has scheduled follow up with her medical oncologist Dr. Burr Medico for ongoing surveillance, and was encouraged to call if she develops concerns or questions regarding radiation.  This concludes the interview.   Leandra Kern, LPN

## 2022-11-01 ENCOUNTER — Inpatient Hospital Stay: Payer: Medicare Other

## 2022-11-01 ENCOUNTER — Encounter: Payer: Self-pay | Admitting: Genetic Counselor

## 2022-11-01 ENCOUNTER — Other Ambulatory Visit: Payer: Self-pay | Admitting: Genetic Counselor

## 2022-11-01 ENCOUNTER — Inpatient Hospital Stay: Payer: Medicare Other | Attending: Hematology | Admitting: Genetic Counselor

## 2022-11-01 DIAGNOSIS — Z803 Family history of malignant neoplasm of breast: Secondary | ICD-10-CM | POA: Insufficient documentation

## 2022-11-01 DIAGNOSIS — D0511 Intraductal carcinoma in situ of right breast: Secondary | ICD-10-CM

## 2022-11-01 LAB — GENETIC SCREENING ORDER

## 2022-11-01 NOTE — Progress Notes (Signed)
REFERRING PROVIDER: Truitt Merle, MD [address] [place],  [place] [address]  PRIMARY PROVIDER:  Donald Prose, MD  PRIMARY REASON FOR VISIT:  1. Family history of breast cancer   2. Ductal carcinoma in situ (DCIS) of right breast      HISTORY OF PRESENT ILLNESS:   Terri [name], a 72 y.o. female, was seen for a [facility] cancer genetics consultation at the request of Dr. Burr Medico due to a personal and family history of breast cancer.  Terri [name] presents to clinic today to discuss the possibility of a hereditary predisposition to cancer, genetic testing, and to further clarify her future cancer risks, as well as potential cancer risks for family members.   In August 2023, at the age of 74, Terri [name] was diagnosed with DCIS of the right breast. The treatment plan lumpectomy and radiation. The patient's daughter has a history of 'water cysts'.  The daughter's provider has asked that Terri [name] undergo genetic testing in order to help follow her care.    CANCER HISTORY:  Oncology History Overview Note   Cancer Staging  Ductal carcinoma in situ (DCIS) of right breast Staging form: Breast, AJCC 8th Edition - Clinical stage from 05/25/2022: Stage 0 (cTis (DCIS), cN0, cM0, G2, ER+, PR+, HER2: Not Assessed) - Signed by Truitt Merle, MD on 05/31/2022    Ductal carcinoma in situ (DCIS) of right breast  05/12/2022 Mammogram   CLINICAL DATA:  72 year old female for further evaluation of RIGHT breast calcifications identified on screening mammogram.   EXAM: DIGITAL DIAGNOSTIC UNILATERAL RIGHT MAMMOGRAM  IMPRESSION: Suspicious OUTER RIGHT breast calcifications spanning a distance of 5.5 cm. Tissue sampling of the anterior and posterior aspects of these calcifications recommended.   05/25/2022 Cancer Staging   Staging form: Breast, AJCC 8th Edition - Clinical stage from 05/25/2022: Stage 0 (cTis (DCIS), cN0, cM0, G2, ER+, PR+, HER2: Not Assessed) - Signed by Truitt Merle, MD on  05/31/2022 Stage prefix: Initial diagnosis Histologic grading system: 3 grade system   05/25/2022 Initial Biopsy   Diagnosis 1. Breast, right, needle core biopsy - DUCTAL CARCINOMA IN SITU, SOLID TYPE WITH COMEDONECROSIS AND ASSOCIATED CALCIFICATIONS, NUCLEAR GRADE 3 OF 3 - NECROSIS: PRESENT - CALCIFICATIONS: PRESENT - DCIS LENGTH: 9 MM IN GREATEST LINEAR DIMENSION ON HEAVILY FRAGMENTED CORES  PROGNOSTIC INDICATORS Results: Estrogen Receptor: 90%, POSITIVE, STRONG STAINING INTENSITY Progesterone Receptor: 10%, POSITIVE, STRONG STAINING INTENSITY   2. Breast, right, needle core biopsy - DUCTAL CARCINOMA IN SITU, SOLID TYPE WITH COMEDONECROSIS AND ASSOCIATED CALCIFICATIONS, NUCLEAR GRADE 3 OF 3 - NECROSIS: PRESENT - CALCIFICATIONS: PRESENT - DCIS LENGTH: 7 MM IN GREATEST LINEAR DIMENSION ON HEAVILY FRAGMENTED CORES  PROGNOSTIC INDICATORS Results: IMMUNOHISTOCHEMICAL AND MORPHOMETRIC ANALYSIS PERFORMED MANUALLY Estrogen Receptor: 20%, POSITIVE, STRONG STAINING INTENSITY Progesterone Receptor: 0%, NEGATIVE   05/30/2022 Initial Diagnosis   Ductal carcinoma in situ (DCIS) of right breast   06/21/2022 Cancer Staging   Staging form: Breast, AJCC 8th Edition - Pathologic stage from 06/21/2022: Stage Unknown (pTis (DCIS), pNX, cM0, G3, ER+, PR-, HER2: Not Assessed) - Signed by Truitt Merle, MD on 09/09/2022 Histologic grading system: 3 grade system Residual tumor (R): R0 - None      RISK FACTORS:  Menarche was at age 84.  First live birth at age 29.  OCP use for approximately 10 years.  Ovaries intact: yes.  Hysterectomy: no.  Menopausal status: postmenopausal.  HRT use: 1 years. Colonoscopy: yes; normal. Mammogram within the last year: yes. Number of breast biopsies: 1. Up to  date with pelvic exams: yes. Any excessive radiation exposure in the past: no  Past Medical History:  Diagnosis Date   Allergy    seasonal   Cancer ([place]) 2023   right breast   Eczema    Family  history of breast cancer    Hypertension    Serrated adenoma of colon     Past Surgical History:  Procedure Laterality Date   BREAST LUMPECTOMY WITH RADIOACTIVE SEED LOCALIZATION Right 06/21/2022   Procedure: RIGHT BREAST LUMPECTOMY WITH RADIOACTIVE SEED LOCALIZATION X2;  Surgeon: Stark Klein, MD;  Location: [facility];  Service: General;  Laterality: Right;   COLONOSCOPY  2016   LASIK  2004   RE-EXCISION OF BREAST LUMPECTOMY Right 07/05/2022   Procedure: RE-EXCISION OF RIGHT BREAST LUMPECTOMY;  Surgeon: Stark Klein, MD;  Location: [facility];  Service: General;  Laterality: Right;   TUBAL LIGATION      Social History   Socioeconomic History   Marital status: Married    Spouse name: Not on file   Number of children: 2   Years of education: Not on file   Highest education level: Not on file  Occupational History   Not on file  Tobacco Use   Smoking status: Never   Smokeless tobacco: Never  Vaping Use   Vaping Use: Never used  Substance and Sexual Activity   Alcohol use: Not Currently    Comment: Maybe 1 beer every 3 months   Drug use: No   Sexual activity: Not on file  Other Topics Concern   Not on file  Social History Narrative   Not on file   Social Determinants of Health   Financial Resource Strain: Not on file  Food Insecurity: Not on file  Transportation Needs: Not on file  Physical Activity: Not on file  Stress: Not on file  Social Connections: Not on file     FAMILY HISTORY:  We obtained a detailed, 4-generation family history.  Significant diagnoses are listed below: Family History  Problem Relation Age of Onset   Leukemia Mother        ?CLL   Lung cancer Sister        x4   Breast cancer Niece        dx in her 59s   Healthy Daughter    Colon cancer Neg Hx    Esophageal cancer Neg Hx    Rectal cancer Neg Hx    Stomach cancer Neg Hx    Colon polyps Neg Hx      The patient has two daughters who are cancer free.  She has four  sister who had lung cancer.  One sister has a daughter with breast cancer in her 53's.  The patient's parents are both deceased.  The patient's mother had leukemia.  She has several siblings who were cancer free. There is no additional cancer reported on this side of the family.  The patient's father is deceased.  He had two sisters who are cancer free. There is no additional cancer reported on this side of the family.  Terri [name] is unaware of previous family history of genetic testing for hereditary cancer risks. Patient's maternal ancestors are of Caucasian descent, and paternal ancestors are of Caucasian descent. There is no reported Ashkenazi Jewish ancestry. There is no known consanguinity.  GENETIC COUNSELING ASSESSMENT: Terri [name] is a 72 y.o. female with a personal and family history of breast cancer which is somewhat suggestive of a hereditary cancer syndrome and  predisposition to cancer given the young age of onset of breast cancer in her niece. We, therefore, discussed and recommended the following at today's visit.   DISCUSSION: We discussed that, in general, most cancer is not inherited in families, but instead is sporadic or familial. Sporadic cancers occur by chance and typically happen at older ages (>50 years) as this type of cancer is caused by genetic changes acquired during an individual's lifetime. Some families have more cancers than would be expected by chance; however, the ages or types of cancer are not consistent with a known genetic mutation or known genetic mutations have been ruled out. This type of familial cancer is thought to be due to a combination of multiple genetic, environmental, hormonal, and lifestyle factors. While this combination of factors likely increases the risk of cancer, the exact source of this risk is not currently identifiable or testable.  We discussed that 5 - 10% of breast cancer is hereditary, with most cases associated with BRCA mutations.   There are other genes that can be associated with hereditary breast cancer syndromes.  These include ATM, CHEK2 and PALB2.  We discussed that testing is beneficial for several reasons including knowing how to follow individuals after completing their treatment, identifying whether potential treatment options such as PARP inhibitors would be beneficial, and understand if other family members could be at risk for cancer and allow them to undergo genetic testing.   We reviewed the characteristics, features and inheritance patterns of hereditary cancer syndromes. We also discussed genetic testing, including the appropriate family members to test, the process of testing, insurance coverage and turn-around-time for results. We discussed the implications of a negative, positive, carrier and/or variant of uncertain significant result. Terri [name]  was offered a common hereditary cancer panel (47 genes) and an expanded pan-cancer panel (77 genes). Terri [name] was informed of the benefits and limitations of each panel, including that expanded pan-cancer panels contain genes that do not have clear management guidelines at this point in time.  We also discussed that as the number of genes included on a panel increases, the chances of variants of uncertain significance increases. Terri [name] decided to pursue genetic testing for the CancerNext-Expanded+RNAinsight gene panel.   The CancerNext-Expanded gene panel offered by [facility] and includes sequencing and rearrangement analysis for the following 77 genes: AIP, ALK, APC*, ATM*, AXIN2, BAP1, BARD1, BLM, BMPR1A, BRCA1*, BRCA2*, BRIP1*, CDC73, CDH1*, CDK4, CDKN1B, CDKN2A, CHEK2*, CTNNA1, DICER1, FANCC, FH, FLCN, GALNT12, KIF1B, LZTR1, MAX, MEN1, MET, MLH1*, MSH2*, MSH3, MSH6*, MUTYH*, NBN, NF1*, NF2, NTHL1, PALB2*, PHOX2B, PMS2*, POT1, PRKAR1A, PTCH1, PTEN*, RAD51C*, RAD51D*, RB1, RECQL, RET, SDHA, SDHAF2, SDHB, SDHC, SDHD, SMAD4, SMARCA4, SMARCB1, SMARCE1,  STK11, SUFU, TMEM127, TP53*, TSC1, TSC2, VHL and XRCC2 (sequencing and deletion/duplication); EGFR, EGLN1, HOXB13, KIT, MITF, PDGFRA, POLD1, and POLE (sequencing only); EPCAM and GREM1 (deletion/duplication only). DNA and RNA analyses performed for * genes.   Based on Terri [name]'s personal and family history of cancer, she meets medical criteria for genetic testing. Despite that she meets criteria, she may still have an out of pocket cost. We discussed that if her out of pocket cost for testing is over $100, the laboratory will call and confirm whether she wants to proceed with testing.  If the out of pocket cost of testing is less than $100 she will be billed by the genetic testing laboratory.   We discussed that some people do not want to undergo genetic testing due to fear of genetic discrimination.  The Genetic Information Nondiscrimination Act (GINA) was signed into federal law in 2008. GINA prohibits health insurers and most employers from discriminating against individuals based on genetic information (including the results of genetic tests and family history information). According to GINA, health insurance companies cannot consider genetic information to be a preexisting condition, nor can they use it to make decisions regarding coverage or rates. GINA also makes it illegal for most employers to use genetic information in making decisions about hiring, firing, promotion, or terms of employment. It is important to note that GINA does not offer protections for life insurance, disability insurance, or long-term care insurance. GINA does not apply to those in the [employer], those who work for companies with less than 15 employees, and new life insurance or long-term disability insurance policies.  Health status due to a cancer diagnosis is not protected under GINA. More information about GINA can be found by visiting [email or web address].   PLAN: After considering the risks, benefits, and limitations, Ms.  [name] provided informed consent to pursue genetic testing and the blood sample was sent to [facility] for analysis of the CancerNext-Expanded+RNAinsight. Results should be available within approximately 2-3 weeks' time, at which point they will be disclosed by telephone to Terri [name], as will any additional recommendations warranted by these results. Terri [name] will receive a summary of her genetic counseling visit and a copy of her results once available. This information will also be available in Epic.   Lastly, we encouraged Terri [name] to remain in contact with cancer genetics annually so that we can continuously update the family history and inform her of any changes in cancer genetics and testing that may be of benefit for this family.   Terri [name] questions were answered to her satisfaction today. Our contact information was provided should additional questions or concerns arise. Thank you for the referral and allowing [place] to share in the care of your patient.   Yunior Jain P. Florene Glen, [place], [facility] Licensed, Insurance risk surveyor [email or web address] phone: [phone]  The patient was seen for a total of 25 minutes in face-to-face genetic counseling.  The patient was seen alone.  Drs. Michell Heinrich, and/or [place] were available for questions, if needed..    _______________________________________________________________________ For Office Staff:  Number of people involved in session: 1 Was an Intern/ student involved with case: no

## 2022-11-14 ENCOUNTER — Telehealth: Payer: Self-pay | Admitting: Genetic Counselor

## 2022-11-14 ENCOUNTER — Encounter: Payer: Self-pay | Admitting: Genetic Counselor

## 2022-11-14 ENCOUNTER — Ambulatory Visit: Payer: Self-pay | Admitting: Genetic Counselor

## 2022-11-14 DIAGNOSIS — Z1379 Encounter for other screening for genetic and chromosomal anomalies: Secondary | ICD-10-CM | POA: Insufficient documentation

## 2022-11-14 NOTE — Progress Notes (Signed)
HPI:  [name] was previously seen in the [facility] clinic due to a personal and family history of breast cancer and concerns regarding a hereditary predisposition to cancer. Please refer to our prior cancer genetics clinic note for more information regarding our discussion, assessment and recommendations, at the time. [name] recent genetic test results were disclosed to her, as were recommendations warranted by these results. These results and recommendations are discussed in more detail below.  CANCER HISTORY:  Oncology History Overview Note   Cancer Staging  Ductal carcinoma in situ (DCIS) of right breast Staging form: Breast, AJCC 8th Edition - Clinical stage from 05/25/2022: Stage 0 (cTis (DCIS), cN0, cM0, G2, ER+, PR+, HER2: Not Assessed) - Signed by Truitt Merle, MD on 05/31/2022    Ductal carcinoma in situ (DCIS) of right breast  05/12/2022 Mammogram   CLINICAL DATA:  72 year old female for further evaluation of RIGHT breast calcifications identified on screening mammogram.   EXAM: DIGITAL DIAGNOSTIC UNILATERAL RIGHT MAMMOGRAM  IMPRESSION: Suspicious OUTER RIGHT breast calcifications spanning a distance of 5.5 cm. Tissue sampling of the anterior and posterior aspects of these calcifications recommended.   05/25/2022 Cancer Staging   Staging form: Breast, AJCC 8th Edition - Clinical stage from 05/25/2022: Stage 0 (cTis (DCIS), cN0, cM0, G2, ER+, PR+, HER2: Not Assessed) - Signed by Truitt Merle, MD on 05/31/2022 Stage prefix: Initial diagnosis Histologic grading system: 3 grade system   05/25/2022 Initial Biopsy   Diagnosis 1. Breast, right, needle core biopsy - DUCTAL CARCINOMA IN SITU, SOLID TYPE WITH COMEDONECROSIS AND ASSOCIATED CALCIFICATIONS, NUCLEAR GRADE 3 OF 3 - NECROSIS: PRESENT - CALCIFICATIONS: PRESENT - DCIS LENGTH: 9 MM IN GREATEST LINEAR DIMENSION ON HEAVILY FRAGMENTED CORES  PROGNOSTIC INDICATORS Results: Estrogen Receptor: 90%, POSITIVE,  STRONG STAINING INTENSITY Progesterone Receptor: 10%, POSITIVE, STRONG STAINING INTENSITY   2. Breast, right, needle core biopsy - DUCTAL CARCINOMA IN SITU, SOLID TYPE WITH COMEDONECROSIS AND ASSOCIATED CALCIFICATIONS, NUCLEAR GRADE 3 OF 3 - NECROSIS: PRESENT - CALCIFICATIONS: PRESENT - DCIS LENGTH: 7 MM IN GREATEST LINEAR DIMENSION ON HEAVILY FRAGMENTED CORES  PROGNOSTIC INDICATORS Results: IMMUNOHISTOCHEMICAL AND MORPHOMETRIC ANALYSIS PERFORMED MANUALLY Estrogen Receptor: 20%, POSITIVE, STRONG STAINING INTENSITY Progesterone Receptor: 0%, NEGATIVE   05/30/2022 Initial Diagnosis   Ductal carcinoma in situ (DCIS) of right breast   06/21/2022 Cancer Staging   Staging form: Breast, AJCC 8th Edition - Pathologic stage from 06/21/2022: Stage Unknown (pTis (DCIS), pNX, cM0, G3, ER+, PR-, HER2: Not Assessed) - Signed by Truitt Merle, MD on 09/09/2022 Histologic grading system: 3 grade system Residual tumor (R): R0 - None     FAMILY HISTORY:  We obtained a detailed, 4-generation family history.  Significant diagnoses are listed below: Family History  Problem Relation Age of Onset   Leukemia Mother        ?CLL   Lung cancer Sister        x4   Breast cancer Niece        dx in her 14s   Healthy Daughter    Colon cancer Neg Hx    Esophageal cancer Neg Hx    Rectal cancer Neg Hx    Stomach cancer Neg Hx    Colon polyps Neg Hx        The patient has two daughters who are cancer free.  She has four sister who had lung cancer.  One sister has a daughter with breast cancer in her 68's.  The patient's parents are both deceased.   The patient's  mother had leukemia.  She has several siblings who were cancer free. There is no additional cancer reported on this side of the family.   The patient's father is deceased.  He had two sisters who are cancer free. There is no additional cancer reported on this side of the family.   [name] is unaware of previous family history of genetic  testing for hereditary cancer risks. Patient's maternal ancestors are of Caucasian descent, and paternal ancestors are of Caucasian descent. There is no reported Ashkenazi Jewish ancestry. There is no known consanguinity  GENETIC TEST RESULTS: Genetic testing reported out on November 13, 2022 through the CancerNext-Expanded+RNAinsight cancer panel found no pathogenic mutations. The CancerNext-Expanded gene panel offered by [facility] and includes sequencing and rearrangement analysis for the following 77 genes: AIP, ALK, APC*, ATM*, AXIN2, BAP1, BARD1, BLM, BMPR1A, BRCA1*, BRCA2*, BRIP1*, CDC73, CDH1*, CDK4, CDKN1B, CDKN2A, CHEK2*, CTNNA1, DICER1, FANCC, FH, FLCN, GALNT12, KIF1B, LZTR1, MAX, MEN1, MET, MLH1*, MSH2*, MSH3, MSH6*, MUTYH*, NBN, NF1*, NF2, NTHL1, PALB2*, PHOX2B, PMS2*, POT1, PRKAR1A, PTCH1, PTEN*, RAD51C*, RAD51D*, RB1, RECQL, RET, SDHA, SDHAF2, SDHB, SDHC, SDHD, SMAD4, SMARCA4, SMARCB1, SMARCE1, STK11, SUFU, TMEM127, TP53*, TSC1, TSC2, VHL and XRCC2 (sequencing and deletion/duplication); EGFR, EGLN1, HOXB13, KIT, MITF, PDGFRA, POLD1, and POLE (sequencing only); EPCAM and GREM1 (deletion/duplication only). DNA and RNA analyses performed for * genes. The test report has been scanned into EPIC and is located under the Molecular Pathology section of the Results Review tab.  A portion of the result report is included below for reference.     We discussed with [name] that because current genetic testing is not perfect, it is possible there may be a gene mutation in one of these genes that current testing cannot detect, but that chance is small.  We also discussed, that there could be another gene that has not yet been discovered, or that we have not yet tested, that is responsible for the cancer diagnoses in the family. It is also possible there is a hereditary cause for the cancer in the family that [name] did not inherit and therefore was not identified in her testing.  Therefore,  it is important to remain in touch with cancer genetics in the future so that we can continue to offer [name] the most up to date genetic testing.   ADDITIONAL GENETIC TESTING: We discussed with [name] that her genetic testing was fairly extensive.  If there are genes identified to increase cancer risk that can be analyzed in the future, we would be happy to discuss and coordinate this testing at that time.    CANCER SCREENING RECOMMENDATIONS: [name] test result is considered negative (normal).  This means that we have not identified a hereditary cause for her personal and family history of breast cancer at this time. Most cancers happen by chance and this negative test suggests that her cancer may fall into this category.    While reassuring, this does not definitively rule out a hereditary predisposition to cancer. It is still possible that there could be genetic mutations that are undetectable by current technology. There could be genetic mutations in genes that have not been tested or identified to increase cancer risk.  Therefore, it is recommended she continue to follow the cancer management and screening guidelines provided by her oncology and primary healthcare provider.   An individual's cancer risk and medical management are not determined by genetic test results alone. Overall cancer risk assessment incorporates additional factors, including personal  medical history, family history, and any available genetic information that may result in a personalized plan for cancer prevention and surveillance   RECOMMENDATIONS FOR FAMILY MEMBERS:  Individuals in this family might be at some increased risk of developing cancer, over the general population risk, simply due to the family history of cancer.  We recommended women in this family have a yearly mammogram beginning at age 20, or 35 years younger than the earliest onset of cancer, an annual clinical breast exam, and perform  monthly breast self-exams. Women in this family should also have a gynecological exam as recommended by their primary provider. All family members should be referred for colonoscopy starting at age 57.  FOLLOW-UP: Lastly, we discussed with [name] that cancer genetics is a rapidly advancing field and it is possible that new genetic tests will be appropriate for her and/or her family members in the future. We encouraged her to remain in contact with cancer genetics on an annual basis so we can update her personal and family histories and let her know of advances in cancer genetics that may benefit this family.   Our contact number was provided. [name] questions were answered to her satisfaction, and she knows she is welcome to call us at anytime with additional questions or concerns.   Roma Kayser, [place], [facility] Licensed, Certified Genetic Counselor [email or web address]

## 2022-11-14 NOTE — Telephone Encounter (Signed)
Revealed negative genetic testing.  Discussed that we do not know why she has breast cancer or why there is cancer in the family. It could be due to a different gene that we are not testing, or maybe our current technology may not be able to pick something up.  It will be important for her to keep in contact with genetics to keep up with whether additional testing may be needed. 

## 2022-12-09 ENCOUNTER — Encounter: Payer: Self-pay | Admitting: Adult Health

## 2022-12-09 ENCOUNTER — Inpatient Hospital Stay: Payer: Medicare Other | Attending: Hematology | Admitting: Adult Health

## 2022-12-09 VITALS — BP 142/55 | HR 100 | Temp 97.8°F | Resp 18 | Ht 67.0 in | Wt 200.2 lb

## 2022-12-09 DIAGNOSIS — Z79899 Other long term (current) drug therapy: Secondary | ICD-10-CM | POA: Insufficient documentation

## 2022-12-09 DIAGNOSIS — D0511 Intraductal carcinoma in situ of right breast: Secondary | ICD-10-CM

## 2022-12-09 NOTE — Progress Notes (Signed)
SURVIVORSHIP VISIT:  BRIEF ONCOLOGIC HISTORY:  Oncology History Overview Note   Cancer Staging  Ductal carcinoma in situ (DCIS) of right breast Staging form: Breast, AJCC 8th Edition - Clinical stage from 05/25/2022: Stage 0 (cTis (DCIS), cN0, cM0, G2, ER+, PR+, HER2: Not Assessed) - Signed by Truitt Merle, MD on 05/31/2022    Ductal carcinoma in situ (DCIS) of right breast  05/12/2022 Mammogram   CLINICAL DATA:  72 year old female for further evaluation of RIGHT breast calcifications identified on screening mammogram.   EXAM: DIGITAL DIAGNOSTIC UNILATERAL RIGHT MAMMOGRAM  IMPRESSION: Suspicious OUTER RIGHT breast calcifications spanning a distance of 5.5 cm. Tissue sampling of the anterior and posterior aspects of these calcifications recommended.   05/25/2022 Cancer Staging   Staging form: Breast, AJCC 8th Edition - Clinical stage from 05/25/2022: Stage 0 (cTis (DCIS), cN0, cM0, G2, ER+, PR+, HER2: Not Assessed) - Signed by Truitt Merle, MD on 05/31/2022 Stage prefix: Initial diagnosis Histologic grading system: 3 grade system   05/25/2022 Initial Biopsy   Diagnosis 1. Breast, right, needle core biopsy - DUCTAL CARCINOMA IN SITU, SOLID TYPE WITH COMEDONECROSIS AND ASSOCIATED CALCIFICATIONS, NUCLEAR GRADE 3 OF 3 - NECROSIS: PRESENT - CALCIFICATIONS: PRESENT - DCIS LENGTH: 9 MM IN GREATEST LINEAR DIMENSION ON HEAVILY FRAGMENTED CORES  PROGNOSTIC INDICATORS Results: Estrogen Receptor: 90%, POSITIVE, STRONG STAINING INTENSITY Progesterone Receptor: 10%, POSITIVE, STRONG STAINING INTENSITY   2. Breast, right, needle core biopsy - DUCTAL CARCINOMA IN SITU, SOLID TYPE WITH COMEDONECROSIS AND ASSOCIATED CALCIFICATIONS, NUCLEAR GRADE 3 OF 3 - NECROSIS: PRESENT - CALCIFICATIONS: PRESENT - DCIS LENGTH: 7 MM IN GREATEST LINEAR DIMENSION ON HEAVILY FRAGMENTED CORES  PROGNOSTIC INDICATORS Results: IMMUNOHISTOCHEMICAL AND MORPHOMETRIC ANALYSIS PERFORMED MANUALLY Estrogen Receptor: 20%,  POSITIVE, STRONG STAINING INTENSITY Progesterone Receptor: 0%, NEGATIVE   05/30/2022 Initial Diagnosis   Ductal carcinoma in situ (DCIS) of right breast   06/21/2022 Cancer Staging   Staging form: Breast, AJCC 8th Edition - Pathologic stage from 06/21/2022: Stage Unknown (pTis (DCIS), pNX, cM0, G3, ER+, PR-, HER2: Not Assessed) - Signed by Truitt Merle, MD on 09/09/2022 Histologic grading system: 3 grade system Residual tumor (R): R0 - None   08/15/2022 - 09/09/2022 Radiation Therapy   Site Technique Total Dose (Gy) Dose per Fx (Gy) Completed Fx Beam Energies  Breast, Right: Breast_R 3D 42.56/42.56 2.66 16/16 10XFFF  Breast, Right: Breast_R_Bst 3D 8/8 2 4/4 6X, 10X     09/2022 -  Anti-estrogen oral therapy   3 mg Tamoxifen x 3 years     INTERVAL HISTORY:  Terri [name] to review her survivorship care plan detailing her treatment course for breast cancer, as well as monitoring long-term side effects of that treatment, education regarding health maintenance, screening, and overall wellness and health promotion.     Overall, Terri [name] reports feeling moderately well.  She has had difficulty with taking tamoxifen and wonders if she needs to really stay on it.  She is experiencing hot flashes and night sweats which make it hard for her to sleep.  REVIEW OF SYSTEMS:  Review of Systems  Constitutional:  Positive for fatigue. Negative for appetite change, chills, fever and unexpected weight change.  HENT:   Negative for hearing loss, lump/mass and trouble swallowing.   Eyes:  Negative for eye problems and icterus.  Respiratory:  Negative for chest tightness, cough and shortness of breath.   Cardiovascular:  Negative for chest pain, leg swelling and palpitations.  Gastrointestinal:  Negative for abdominal distention, abdominal pain, constipation, diarrhea, nausea and vomiting.  Endocrine: Positive for hot flashes.  Genitourinary:  Negative for difficulty urinating.   Musculoskeletal:   Negative for arthralgias.  Skin:  Negative for itching and rash.  Neurological:  Negative for dizziness, extremity weakness, headaches and numbness.  Hematological:  Negative for adenopathy. Does not bruise/bleed easily.  Psychiatric/Behavioral:  Positive for sleep disturbance. Negative for depression. The patient is not nervous/anxious.    Breast: Denies any new nodularity, masses, tenderness, nipple changes, or nipple discharge.      PAST MEDICAL/SURGICAL HISTORY:  Past Medical History:  Diagnosis Date   Allergy    seasonal   Cancer [facility]) 2023   right breast   Eczema    Family history of breast cancer    Hypertension    Serrated adenoma of colon    Past Surgical History:  Procedure Laterality Date   BREAST LUMPECTOMY WITH RADIOACTIVE SEED LOCALIZATION Right 06/21/2022   Procedure: RIGHT BREAST LUMPECTOMY WITH RADIOACTIVE SEED LOCALIZATION X2;  Surgeon: Stark Klein, MD;  Location: [place];  Service: General;  Laterality: Right;   COLONOSCOPY  2016   LASIK  2004   RE-EXCISION OF BREAST LUMPECTOMY Right 07/05/2022   Procedure: RE-EXCISION OF RIGHT BREAST LUMPECTOMY;  Surgeon: Stark Klein, MD;  Location: [place];  Service: General;  Laterality: Right;   TUBAL LIGATION       ALLERGIES:  No Known Allergies   CURRENT MEDICATIONS:  Outpatient Encounter Medications as of 12/09/2022  Medication Sig   amLODipine (NORVASC) 5 MG tablet Take 5 mg by mouth daily.   aspirin (ASPIRIN 81) 81 MG chewable tablet Chew 81 mg by mouth daily.   hydrocortisone 2.5 % cream Apply 1 Application topically 2 (two) times daily.   losartan (COZAAR) 100 MG tablet Take 100 mg by mouth daily.   Multiple Vitamin (MULTIVITAMIN) tablet Take 1 tablet by mouth daily.   tamoxifen (NOLVADEX) 10 MG tablet Take 0.5 tablets (5 mg total) by mouth daily.   [DISCONTINUED] oxyCODONE (OXY IR/ROXICODONE) 5 MG immediate release tablet Take 1 tablet (5 mg total) by mouth every 6 (six) hours as needed  for severe pain.   No facility-administered encounter medications on file as of 12/09/2022.     ONCOLOGIC FAMILY HISTORY:  Family History  Problem Relation Age of Onset   Leukemia Mother        ?CLL   Lung cancer Sister        x4   Breast cancer Niece        dx in her 77s   Healthy Daughter    Colon cancer Neg Hx    Esophageal cancer Neg Hx    Rectal cancer Neg Hx    Stomach cancer Neg Hx    Colon polyps Neg Hx      SOCIAL HISTORY:  Social History   Socioeconomic History   Marital status: Married    Spouse name: Not on file   Number of children: 2   Years of education: Not on file   Highest education level: Not on file  Occupational History   Not on file  Tobacco Use   Smoking status: Never   Smokeless tobacco: Never  Vaping Use   Vaping Use: Never used  Substance and Sexual Activity   Alcohol use: Not Currently    Comment: Maybe 1 beer every 3 months   Drug use: No   Sexual activity: Not on file  Other Topics Concern   Not on file  Social History Narrative   Not on file  Social Determinants of Health   Financial Resource Strain: Not on file  Food Insecurity: Not on file  Transportation Needs: Not on file  Physical Activity: Not on file  Stress: Not on file  Social Connections: Not on file  Intimate Partner Violence: Not on file     OBSERVATIONS/OBJECTIVE:  BP (!) 142/55 (BP Location: Left Arm, Patient Position: Sitting)   Pulse 100   Temp 97.8 F (36.6 C) (Temporal)   Resp 18   Ht 5\' 7"  (1.702 m)   Wt 200 lb 3.2 oz (90.8 kg)   SpO2 100%   BMI 31.36 kg/m  GENERAL: Patient is a well appearing female in no acute distress HEENT:  Sclerae anicteric.  Oropharynx clear and moist. No ulcerations or evidence of oropharyngeal candidiasis. Neck is supple.  NODES:  No cervical, supraclavicular, or axillary lymphadenopathy palpated.  BREAST EXAM: Right breast status postlumpectomy and radiation no sign of local recurrence left breast is benign.    LUNGS:  Clear to auscultation bilaterally.  No wheezes or rhonchi. HEART:  Regular rate and rhythm. No murmur appreciated. ABDOMEN:  Soft, nontender.  Positive, normoactive bowel sounds. No organomegaly palpated. MSK:  No focal spinal tenderness to palpation. Full range of motion bilaterally in the upper extremities. EXTREMITIES:  No peripheral edema.   SKIN:  Clear with no obvious rashes or skin changes. No nail dyscrasia. NEURO:  Nonfocal. Well oriented.  Appropriate affect.   LABORATORY DATA:  None for this visit.  DIAGNOSTIC IMAGING:  None for this visit.      ASSESSMENT AND PLAN:  Terri [name] is a pleasant 72 y.o. female with Stage 0 right breast DCIS, ER+/PR+/HER2-, diagnosed in 05/2022, treated with lumpectomy, adjuvant radiation therapy, and anti-estrogen therapy with Tamoxifen beginning in 09/2022.  She presents to the Survivorship Clinic for our initial meeting and routine follow-up post-completion of treatment for breast cancer.    1. Stage 0 right breast cancer:  Terri [name] is continuing to recover from definitive treatment for breast cancer. She will follow-up with her medical oncologist, Dr. Burr Medico in 02/2023 with history and physical exam per surveillance protocol.  We reviewed the [place] DCIS nomogram and her risk of recurrence in the same breast.  Without antiestrogen therapy over the next 5 years that is 4%.  She is opted with this risk that she would like to forego antiestrogen therapy due to the side effects.  Her mammogram is due 04/2023; orders placed today.  We discussed the potential for intensified screening since she will not take antiestrogen therapy.  She sees Dr. Morey Hummingbird in June and can discuss breast MRI with her and whether she wants to do this in February.   Today, a comprehensive survivorship care plan and treatment summary was reviewed with the patient today detailing her breast cancer diagnosis, treatment course, potential  late/long-term effects of treatment, appropriate follow-up care with recommendations for the future, and patient education resources.  A copy of this summary, along with a letter will be sent to the patient's primary care provider via mail/fax/In Basket message after today's visit.    2. Bone health:  She was given education on specific activities to promote bone health.  3. Cancer screening:  Due to Terri [name]'s history and her age, she should receive screening for skin cancers, colon cancer, and gynecologic cancers.  The information and recommendations are listed on the patient's comprehensive care plan/treatment summary and were reviewed in detail with the patient.    4. Health maintenance  and wellness promotion: Terri [name] was encouraged to consume 5-7 servings of fruits and vegetables per day. We reviewed the "Nutrition Rainbow" handout.  She was also encouraged to engage in moderate to vigorous exercise for 30 minutes per day most days of the week. She was instructed to limit her alcohol consumption and continue to abstain from tobacco use.     5. Support services/counseling: It is not uncommon for this period of the patient's cancer care trajectory to be one of many emotions and stressors.  She was given information regarding our available services and encouraged to contact me with any questions or for help enrolling in any of our support group/programs.    Follow up instructions:    -Return to cancer center 02/2023 for f/u with Dr. Burr Medico  -Mammogram due in 04/2023 -Consider breast MRI in 10/2023 -She is welcome to return back to the Survivorship Clinic at any time; no additional follow-up needed at this time.  -Consider referral back to survivorship as a long-term survivor for continued surveillance  The patient was provided an opportunity to ask questions and all were answered. The patient agreed with the plan and demonstrated an understanding of the instructions.   Total encounter  time:40 minutes*in face-to-face visit time, chart review, lab review, care coordination, order entry, and documentation of the encounter time.    Wilber Bihari, NP 12/09/22 11:48 AM Medical Oncology and Hematology [facility] [place], [place] [address] Tel. [phone]    Fax. [phone]  *Total Encounter Time as defined by the Centers for Medicare and Medicaid Services includes, in addition to the face-to-face time of a patient visit (documented in the note above) non-face-to-face time: obtaining and reviewing outside history, ordering and reviewing medications, tests or procedures, care coordination (communications with other health care professionals or caregivers) and documentation in the medical record.

## 2023-01-16 DIAGNOSIS — C50411 Malignant neoplasm of upper-outer quadrant of right female breast: Secondary | ICD-10-CM | POA: Diagnosis not present

## 2023-01-16 DIAGNOSIS — Z170 Estrogen receptor positive status [ER+]: Secondary | ICD-10-CM | POA: Diagnosis not present

## 2023-02-14 DIAGNOSIS — L218 Other seborrheic dermatitis: Secondary | ICD-10-CM | POA: Diagnosis not present

## 2023-02-14 DIAGNOSIS — L720 Epidermal cyst: Secondary | ICD-10-CM | POA: Diagnosis not present

## 2023-02-14 DIAGNOSIS — Z129 Encounter for screening for malignant neoplasm, site unspecified: Secondary | ICD-10-CM | POA: Diagnosis not present

## 2023-03-10 ENCOUNTER — Other Ambulatory Visit: Payer: Self-pay

## 2023-03-10 ENCOUNTER — Inpatient Hospital Stay: Payer: Medicare Other | Attending: Hematology

## 2023-03-10 ENCOUNTER — Inpatient Hospital Stay (HOSPITAL_BASED_OUTPATIENT_CLINIC_OR_DEPARTMENT_OTHER): Payer: Medicare Other | Admitting: Hematology

## 2023-03-10 VITALS — BP 145/69 | HR 73 | Temp 98.1°F | Resp 18 | Ht 67.0 in | Wt 198.8 lb

## 2023-03-10 DIAGNOSIS — Z79899 Other long term (current) drug therapy: Secondary | ICD-10-CM | POA: Insufficient documentation

## 2023-03-10 DIAGNOSIS — Z923 Personal history of irradiation: Secondary | ICD-10-CM | POA: Insufficient documentation

## 2023-03-10 DIAGNOSIS — D0511 Intraductal carcinoma in situ of right breast: Secondary | ICD-10-CM

## 2023-03-10 LAB — CBC WITH DIFFERENTIAL (CANCER CENTER ONLY)
Abs Immature Granulocytes: 0.01 10*3/uL (ref 0.00–0.07)
Basophils Absolute: 0.1 10*3/uL (ref 0.0–0.1)
Basophils Relative: 1 %
Eosinophils Absolute: 0.2 10*3/uL (ref 0.0–0.5)
Eosinophils Relative: 4 %
HCT: 42.5 % (ref 36.0–46.0)
Hemoglobin: 14.6 g/dL (ref 12.0–15.0)
Immature Granulocytes: 0 %
Lymphocytes Relative: 26 %
Lymphs Abs: 1.4 10*3/uL (ref 0.7–4.0)
MCH: 31.5 pg (ref 26.0–34.0)
MCHC: 34.4 g/dL (ref 30.0–36.0)
MCV: 91.6 fL (ref 80.0–100.0)
Monocytes Absolute: 0.5 10*3/uL (ref 0.1–1.0)
Monocytes Relative: 9 %
Neutro Abs: 3.2 10*3/uL (ref 1.7–7.7)
Neutrophils Relative %: 60 %
Platelet Count: 223 10*3/uL (ref 150–400)
RBC: 4.64 MIL/uL (ref 3.87–5.11)
RDW: 12.6 % (ref 11.5–15.5)
WBC Count: 5.3 10*3/uL (ref 4.0–10.5)
nRBC: 0 % (ref 0.0–0.2)

## 2023-03-10 LAB — CMP (CANCER CENTER ONLY)
ALT: 32 U/L (ref 0–44)
AST: 25 U/L (ref 15–41)
Albumin: 4.3 g/dL (ref 3.5–5.0)
Alkaline Phosphatase: 86 U/L (ref 38–126)
Anion gap: 6 (ref 5–15)
BUN: 19 mg/dL (ref 8–23)
CO2: 27 mmol/L (ref 22–32)
Calcium: 9.4 mg/dL (ref 8.9–10.3)
Chloride: 109 mmol/L (ref 98–111)
Creatinine: 0.73 mg/dL (ref 0.44–1.00)
GFR, Estimated: >60 mL/min (ref >60)
Glucose, Bld: 103 mg/dL — ABNORMAL HIGH (ref 70–99)
Potassium: 3.8 mmol/L (ref 3.5–5.1)
Sodium: 142 mmol/L (ref 135–145)
Total Bilirubin: 0.6 mg/dL (ref 0.3–1.2)
Total Protein: 7 g/dL (ref 6.5–8.1)

## 2023-03-10 NOTE — Progress Notes (Unsigned)
[facility]   Telephone:([phone] Fax:([phone]) [phone]   Clinic Follow up Note   Patient Care Team: Terri James, MD as PCP - General (Family Medicine) Almond Lint, MD as Consulting Physician (General Surgery) Malachy Mood, MD as Consulting Physician (Hematology) Dorothy Puffer, MD as Consulting Physician (Radiation Oncology)  Date of Service:  03/10/2023  CHIEF COMPLAINT: f/u of Ductal carcinoma in situ (DCIS) of right breast   CURRENT THERAPY: surveillance    ASSESSMENT:  Terri [name] is a 72 y.o. female with   Ductal carcinoma in situ (DCIS) of right breast -Diagnosed in 04/2022, found on screening MM, ER+/PR+ -s/p lumpectomy 06/21/22, showed G3 DCIS, she required secondary surgery to clear margins  -she completed adjuvant radiation now -She started tamoxifen 5 mg daily in December 2023, she had a moderate side effect especially hot flashes, and she stopped in March 2024. -She is clinically doing well, lab reviewed, exam was unremarkable, there is no clinical concern for breast cancer.  She is due for mammogram in August 2024. -will continue cancer surveillance, with annual mammogram and breast exam with her GYN and PCP.  I will see her as needed.    PLAN: -lab reviewed --CMP pending -Mammogram due in 04/2023 -f/u  as needed  SUMMARY OF ONCOLOGIC HISTORY: Oncology History Overview Note   Cancer Staging  Ductal carcinoma in situ (DCIS) of right breast Staging form: Breast, AJCC 8th Edition - Clinical stage from 05/25/2022: Stage 0 (cTis (DCIS), cN0, cM0, G2, ER+, PR+, HER2: Not Assessed) - Signed by Malachy Mood, MD on 05/31/2022    Ductal carcinoma in situ (DCIS) of right breast  05/12/2022 Mammogram   CLINICAL DATA:  72 year old female for further evaluation of RIGHT breast calcifications identified on screening mammogram.   EXAM: DIGITAL DIAGNOSTIC UNILATERAL RIGHT MAMMOGRAM  IMPRESSION: Suspicious OUTER RIGHT breast calcifications spanning a distance  of 5.5 cm. Tissue sampling of the anterior and posterior aspects of these calcifications recommended.   05/25/2022 Cancer Staging   Staging form: Breast, AJCC 8th Edition - Clinical stage from 05/25/2022: Stage 0 (cTis (DCIS), cN0, cM0, G2, ER+, PR+, HER2: Not Assessed) - Signed by Malachy Mood, MD on 05/31/2022 Stage prefix: Initial diagnosis Histologic grading system: 3 grade system   05/25/2022 Initial Biopsy   Diagnosis 1. Breast, right, needle core biopsy - DUCTAL CARCINOMA IN SITU, SOLID TYPE WITH COMEDONECROSIS AND ASSOCIATED CALCIFICATIONS, NUCLEAR GRADE 3 OF 3 - NECROSIS: PRESENT - CALCIFICATIONS: PRESENT - DCIS LENGTH: 9 MM IN GREATEST LINEAR DIMENSION ON HEAVILY FRAGMENTED CORES  PROGNOSTIC INDICATORS Results: Estrogen Receptor: 90%, POSITIVE, STRONG STAINING INTENSITY Progesterone Receptor: 10%, POSITIVE, STRONG STAINING INTENSITY   2. Breast, right, needle core biopsy - DUCTAL CARCINOMA IN SITU, SOLID TYPE WITH COMEDONECROSIS AND ASSOCIATED CALCIFICATIONS, NUCLEAR GRADE 3 OF 3 - NECROSIS: PRESENT - CALCIFICATIONS: PRESENT - DCIS LENGTH: 7 MM IN GREATEST LINEAR DIMENSION ON HEAVILY FRAGMENTED CORES  PROGNOSTIC INDICATORS Results: IMMUNOHISTOCHEMICAL AND MORPHOMETRIC ANALYSIS PERFORMED MANUALLY Estrogen Receptor: 20%, POSITIVE, STRONG STAINING INTENSITY Progesterone Receptor: 0%, NEGATIVE   05/30/2022 Initial Diagnosis   Ductal carcinoma in situ (DCIS) of right breast   06/21/2022 Cancer Staging   Staging form: Breast, AJCC 8th Edition - Pathologic stage from 06/21/2022: Stage Unknown (pTis (DCIS), pNX, cM0, G3, ER+, PR-, HER2: Not Assessed) - Signed by Malachy Mood, MD on 09/09/2022 Histologic grading system: 3 grade system Residual tumor (R): R0 - None   08/15/2022 - 09/09/2022 Radiation Therapy   Site Technique Total Dose (Gy) Dose per Fx (Gy) Completed  Fx Beam Energies  Breast, Right: Breast_R 3D 42.56/42.56 2.66 16/16 10XFFF  Breast, Right: Breast_R_Bst 3D 8/8 2 4/4  6X, 10X     09/2022 -  Anti-estrogen oral therapy   3 mg Tamoxifen x 3 years      INTERVAL HISTORY:  Terri [name] is here for a follow up of Ductal carcinoma in situ (DCIS) of right breast She was last seen by NP Lacie on  She presents to the clinic alone. Pt state that she has some side effect from  Tamoxifen ,ike night sweats and sit was interfering with her sleep. She has discontinue the Tamoxifen.      All other systems were reviewed with the patient and are negative.  MEDICAL HISTORY:  Past Medical History:  Diagnosis Date   Allergy    seasonal   Cancer [facility]) 2023   right breast   Eczema    Family history of breast cancer    Hypertension    Serrated adenoma of colon     SURGICAL HISTORY: Past Surgical History:  Procedure Laterality Date   BREAST LUMPECTOMY WITH RADIOACTIVE SEED LOCALIZATION Right 06/21/2022   Procedure: RIGHT BREAST LUMPECTOMY WITH RADIOACTIVE SEED LOCALIZATION X2;  Surgeon: Almond Lint, MD;  Location: [facility];  Service: General;  Laterality: Right;   COLONOSCOPY  2016   LASIK  2004   RE-EXCISION OF BREAST LUMPECTOMY Right 07/05/2022   Procedure: RE-EXCISION OF RIGHT BREAST LUMPECTOMY;  Surgeon: Almond Lint, MD;  Location: [facility];  Service: General;  Laterality: Right;   TUBAL LIGATION      I have reviewed the social history and family history with the patient and they are unchanged from previous note.  ALLERGIES:  has No Known Allergies.  MEDICATIONS:  Current Outpatient Medications  Medication Sig Dispense Refill   amLODipine (NORVASC) 5 MG tablet Take 5 mg by mouth daily.     aspirin (ASPIRIN 81) 81 MG chewable tablet Chew 81 mg by mouth daily.     hydrocortisone 2.5 % cream Apply 1 Application topically 2 (two) times daily.     losartan (COZAAR) 100 MG tablet Take 100 mg by mouth daily.     Multiple Vitamin (MULTIVITAMIN) tablet Take 1 tablet by mouth daily.     No current facility-administered medications for  this visit.    PHYSICAL EXAMINATION: ECOG PERFORMANCE STATUS: 0 - Asymptomatic  There were no vitals filed for this visit. Wt Readings from Last 3 Encounters:  12/09/22 200 lb 3.2 oz (90.8 kg)  09/09/22 191 lb 1.6 oz (86.7 kg)  08/03/22 196 lb 8 oz (89.1 kg)    GENERAL:alert, no distress and comfortable SKIN: skin color normal, no rashes or significant lesions EYES: normal, Conjunctiva are pink and non-injected, sclera clear  NEURO: alert & oriented x 3 with fluent speech NECK:(-) supple, thyroid normal size, non-tender, without nodularity LYMPH:  (-) no palpable lymphadenopathy in the cervical, axillary  LUNGS: clear to auscultation and percussion with normal breathing effort HEART: regular rate & rhythm and no murmurs and no lower extremity edema ABDOMEN:(-)abdomen soft, (-)non-tender and normal bowel sounds BREAST: rt BREAST some scar tissue around the nipple. LT breast no palpable mass. Breast exam benign.  LABORATORY DATA:  I have reviewed the data as listed    Latest Ref Rng & Units 06/01/2022   12:23 PM  CBC  WBC 4.0 - 10.5 K/uL 7.6   Hemoglobin 12.0 - 15.0 g/dL 16.1   Hematocrit 09.6 - 46.0 % 42.7   Platelets  150 - 400 K/uL 251         Latest Ref Rng & Units 06/01/2022   12:23 PM  CMP  Glucose 70 - 99 mg/dL 161   BUN 8 - 23 mg/dL 15   Creatinine 0.96 - 1.00 mg/dL 0.45   Sodium 409 - 811 mmol/L 140   Potassium 3.5 - 5.1 mmol/L 3.7   Chloride 98 - 111 mmol/L 107   CO2 22 - 32 mmol/L 26   Calcium 8.9 - 10.3 mg/dL 9.9   Total Protein 6.5 - 8.1 g/dL 7.3   Total Bilirubin 0.3 - 1.2 mg/dL 0.6   Alkaline Phos 38 - 126 U/L 94   AST 15 - 41 U/L 22   ALT 0 - 44 U/L 21       RADIOGRAPHIC STUDIES: I have personally reviewed the radiological images as listed and agreed with the findings in the report. No results found.    No orders of the defined types were placed in this encounter.  All questions were answered. The patient knows to call the clinic with any  problems, questions or concerns. No barriers to learning was detected. The total time spent in the appointment was 20 minutes.     Malachy Mood, MD 03/10/2023   Carolin Coy, CMA, am acting as scribe for Malachy Mood, MD.   I have reviewed the above documentation for accuracy and completeness, and I agree with the above.

## 2023-03-10 NOTE — Assessment & Plan Note (Signed)
-  Diagnosed in 04/2022, found on screening MM, ER+/PR+ -s/p lumpectomy 06/21/22, showed G3 DCIS, she required secondary surgery to clear margins  -she completed adjuvant radiation now -She started tamoxifen 5 mg daily in December 2023, she had a moderate side effect especially hot flashes, and she stopped in March 2024. -She is clinically doing well, lab reviewed, exam was unremarkable, there is no clinical concern for breast cancer.  She is due for mammogram in August 2024.

## 2023-03-14 ENCOUNTER — Encounter: Payer: Self-pay | Admitting: Hematology

## 2023-04-04 DIAGNOSIS — R7303 Prediabetes: Secondary | ICD-10-CM | POA: Diagnosis not present

## 2023-04-04 DIAGNOSIS — Z13220 Encounter for screening for lipoid disorders: Secondary | ICD-10-CM | POA: Diagnosis not present

## 2023-04-04 DIAGNOSIS — I10 Essential (primary) hypertension: Secondary | ICD-10-CM | POA: Diagnosis not present

## 2023-04-04 DIAGNOSIS — Z86000 Personal history of in-situ neoplasm of breast: Secondary | ICD-10-CM | POA: Diagnosis not present

## 2023-04-04 DIAGNOSIS — K529 Noninfective gastroenteritis and colitis, unspecified: Secondary | ICD-10-CM | POA: Diagnosis not present

## 2023-04-04 DIAGNOSIS — Z136 Encounter for screening for cardiovascular disorders: Secondary | ICD-10-CM | POA: Diagnosis not present

## 2023-04-04 DIAGNOSIS — Z0000 Encounter for general adult medical examination without abnormal findings: Secondary | ICD-10-CM | POA: Diagnosis not present

## 2023-05-15 ENCOUNTER — Ambulatory Visit
Admission: RE | Admit: 2023-05-15 | Discharge: 2023-05-15 | Disposition: A | Discharge status: Home / Self Care | Payer: Medicare Other | Source: Ambulatory Visit | Attending: Adult Health | Admitting: Adult Health

## 2023-05-15 DIAGNOSIS — D0511 Intraductal carcinoma in situ of right breast: Secondary | ICD-10-CM

## 2023-05-15 DIAGNOSIS — Z853 Personal history of malignant neoplasm of breast: Secondary | ICD-10-CM | POA: Diagnosis not present

## 2023-05-24 ENCOUNTER — Other Ambulatory Visit: Payer: Self-pay

## 2023-06-09 DIAGNOSIS — H524 Presbyopia: Secondary | ICD-10-CM | POA: Diagnosis not present

## 2023-06-09 DIAGNOSIS — H2513 Age-related nuclear cataract, bilateral: Secondary | ICD-10-CM | POA: Diagnosis not present

## 2024-02-15 DIAGNOSIS — Z129 Encounter for screening for malignant neoplasm, site unspecified: Secondary | ICD-10-CM | POA: Diagnosis not present

## 2024-02-15 DIAGNOSIS — C44519 Basal cell carcinoma of skin of other part of trunk: Secondary | ICD-10-CM | POA: Diagnosis not present

## 2024-02-15 DIAGNOSIS — L821 Other seborrheic keratosis: Secondary | ICD-10-CM | POA: Diagnosis not present

## 2024-02-15 DIAGNOSIS — L218 Other seborrheic dermatitis: Secondary | ICD-10-CM | POA: Diagnosis not present

## 2024-02-15 DIAGNOSIS — D485 Neoplasm of uncertain behavior of skin: Secondary | ICD-10-CM | POA: Diagnosis not present

## 2024-03-05 DIAGNOSIS — C44519 Basal cell carcinoma of skin of other part of trunk: Secondary | ICD-10-CM | POA: Diagnosis not present

## 2024-04-02 ENCOUNTER — Other Ambulatory Visit: Payer: Self-pay | Admitting: Family Medicine

## 2024-04-02 DIAGNOSIS — Z853 Personal history of malignant neoplasm of breast: Secondary | ICD-10-CM

## 2024-04-08 DIAGNOSIS — M25569 Pain in unspecified knee: Secondary | ICD-10-CM | POA: Diagnosis not present

## 2024-04-08 DIAGNOSIS — R109 Unspecified abdominal pain: Secondary | ICD-10-CM | POA: Diagnosis not present

## 2024-04-08 DIAGNOSIS — Z86000 Personal history of in-situ neoplasm of breast: Secondary | ICD-10-CM | POA: Diagnosis not present

## 2024-04-08 DIAGNOSIS — H6991 Unspecified Eustachian tube disorder, right ear: Secondary | ICD-10-CM | POA: Diagnosis not present

## 2024-04-08 DIAGNOSIS — Z13220 Encounter for screening for lipoid disorders: Secondary | ICD-10-CM | POA: Diagnosis not present

## 2024-04-08 DIAGNOSIS — Z0000 Encounter for general adult medical examination without abnormal findings: Secondary | ICD-10-CM | POA: Diagnosis not present

## 2024-04-08 DIAGNOSIS — Z136 Encounter for screening for cardiovascular disorders: Secondary | ICD-10-CM | POA: Diagnosis not present

## 2024-04-08 DIAGNOSIS — I10 Essential (primary) hypertension: Secondary | ICD-10-CM | POA: Diagnosis not present

## 2024-04-08 DIAGNOSIS — R7303 Prediabetes: Secondary | ICD-10-CM | POA: Diagnosis not present

## 2024-05-15 ENCOUNTER — Ambulatory Visit
Admission: RE | Admit: 2024-05-15 | Discharge: 2024-05-15 | Disposition: A | Discharge status: Home / Self Care | Source: Ambulatory Visit | Attending: Family Medicine | Admitting: Family Medicine

## 2024-05-15 DIAGNOSIS — Z853 Personal history of malignant neoplasm of breast: Secondary | ICD-10-CM

## 2024-05-15 DIAGNOSIS — R928 Other abnormal and inconclusive findings on diagnostic imaging of breast: Secondary | ICD-10-CM | POA: Diagnosis not present

## 2024-06-10 DIAGNOSIS — H2513 Age-related nuclear cataract, bilateral: Secondary | ICD-10-CM | POA: Diagnosis not present

## 2024-07-16 DIAGNOSIS — C44519 Basal cell carcinoma of skin of other part of trunk: Secondary | ICD-10-CM | POA: Diagnosis not present
